# Patient Record
Sex: Female | Born: 1970
Health system: Southern US, Community
[De-identification: ages and names within clinical notes are randomized; demographics above are authoritative.]

## PROBLEM LIST (undated history)

## (undated) DIAGNOSIS — I10 Essential (primary) hypertension: Secondary | ICD-10-CM

## (undated) DIAGNOSIS — M5136 Other intervertebral disc degeneration, lumbar region: Secondary | ICD-10-CM

## (undated) DIAGNOSIS — M51369 Other intervertebral disc degeneration, lumbar region without mention of lumbar back pain or lower extremity pain: Secondary | ICD-10-CM

## (undated) DIAGNOSIS — F909 Attention-deficit hyperactivity disorder, unspecified type: Secondary | ICD-10-CM

## (undated) HISTORY — PX: TONSILLECTOMY: SUR1361

## (undated) HISTORY — DX: Essential (primary) hypertension: I10

## (undated) HISTORY — DX: Other intervertebral disc degeneration, lumbar region: M51.36

## (undated) HISTORY — DX: Other intervertebral disc degeneration, lumbar region without mention of lumbar back pain or lower extremity pain: M51.369

## (undated) HISTORY — DX: Attention-deficit hyperactivity disorder, unspecified type: F90.9

---

## 2003-08-10 ENCOUNTER — Other Ambulatory Visit: Admission: RE | Admit: 2003-08-10 | Discharge: 2003-08-10 | Payer: Self-pay

## 2004-07-16 ENCOUNTER — Other Ambulatory Visit: Admission: RE | Admit: 2004-07-16 | Discharge: 2004-07-16 | Payer: Self-pay | Admitting: Obstetrics and Gynecology

## 2004-09-12 ENCOUNTER — Encounter: Admission: RE | Admit: 2004-09-12 | Discharge: 2004-09-12 | Payer: Self-pay | Admitting: Obstetrics and Gynecology

## 2005-07-31 ENCOUNTER — Encounter: Admission: RE | Admit: 2005-07-31 | Discharge: 2005-07-31 | Payer: Self-pay | Admitting: Family Medicine

## 2006-01-28 ENCOUNTER — Other Ambulatory Visit: Admission: RE | Admit: 2006-01-28 | Discharge: 2006-01-28 | Payer: Self-pay | Admitting: Obstetrics and Gynecology

## 2008-02-20 ENCOUNTER — Emergency Department (HOSPITAL_COMMUNITY): Admission: EM | Admit: 2008-02-20 | Discharge: 2008-02-20 | Payer: Self-pay | Admitting: Family Medicine

## 2008-12-28 HISTORY — PX: LUMBAR DISC SURGERY: SHX700

## 2009-04-08 ENCOUNTER — Emergency Department (HOSPITAL_COMMUNITY): Admission: EM | Admit: 2009-04-08 | Discharge: 2009-04-08 | Payer: Self-pay | Admitting: Family Medicine

## 2010-05-08 ENCOUNTER — Ambulatory Visit (HOSPITAL_COMMUNITY): Admission: RE | Admit: 2010-05-08 | Discharge: 2010-05-09 | Payer: Self-pay | Admitting: Neurosurgery

## 2011-03-17 LAB — SURGICAL PCR SCREEN
MRSA, PCR: NEGATIVE
Staphylococcus aureus: NEGATIVE

## 2011-03-17 LAB — CBC
HCT: 37.5 % (ref 36.0–46.0)
Hemoglobin: 13.2 g/dL (ref 12.0–15.0)
MCHC: 35.1 g/dL (ref 30.0–36.0)
MCV: 99.3 fL (ref 78.0–100.0)
Platelets: 244 10*3/uL (ref 150–400)
RBC: 3.77 MIL/uL — ABNORMAL LOW (ref 3.87–5.11)
RDW: 12.4 % (ref 11.5–15.5)
WBC: 6.5 10*3/uL (ref 4.0–10.5)

## 2016-01-02 MED FILL — VYVANSE 30 MG CAPSULE: 30 | 30 days supply | Qty: 30 | Fill #0

## 2016-01-30 ENCOUNTER — Encounter: Payer: Self-pay | Admitting: Cardiovascular Disease

## 2016-01-30 ENCOUNTER — Ambulatory Visit (INDEPENDENT_AMBULATORY_CARE_PROVIDER_SITE_OTHER): Payer: 59 | Admitting: Cardiovascular Disease

## 2016-01-30 VITALS — BP 120/90 | HR 72 | Ht 66.0 in | Wt 162.0 lb

## 2016-01-30 DIAGNOSIS — R0789 Other chest pain: Secondary | ICD-10-CM | POA: Diagnosis not present

## 2016-01-30 DIAGNOSIS — I10 Essential (primary) hypertension: Secondary | ICD-10-CM | POA: Diagnosis not present

## 2016-01-30 NOTE — Patient Instructions (Signed)
Medication Instructions:  Your physician recommends that you continue on your current medications as directed. Please refer to the Current Medication list given to you today.  Labwork: No new orders.   Testing/Procedures: Your physician has requested that you have a stress echocardiogram. For further information please visit [email or web address]. Please follow instruction sheet as given.  Follow-Up: Your physician recommends that you schedule a follow-up appointment as needed with Dr Cooper.    Any Other Special Instructions Will Be Listed Below (If Applicable).     If you need a refill on your cardiac medications before your next appointment, please call your pharmacy.   

## 2016-01-30 NOTE — Progress Notes (Signed)
Cardiology Office Note Date:  01/30/2016   ID:  Alison [name], DOB 1971-10-07, MRN [id number]  PCP:  Lilia Argue  Cardiologist:  Tonny Bollman, MD    Chief Complaint  Patient presents with  . Chest Pain   History of Present Illness: Alison [name] is a 45 y.o. female who presents for evaluation of chest pain. She describes about one - two months of left-sided and substernal chest pain. Pain is left-sided, feels sharp, and radiates to the left shoulder. Symptoms last up to a few minutes and have occurred at rest. Denies exertional chest discomfort but hasn't been exercising.  She denies associated symptoms. She specifically denies shortness of breath, leg swelling, or heart palpitations. There is no positional component to her chest pain. Symptoms have progressed over the last month.  Past Medical History  Diagnosis Date  . Adult ADHD     Past Surgical History  Procedure Laterality Date  . Tonsillectomy    . Lower back surgery    . Disect    . Cervical discectomy      Current Outpatient Prescriptions  Medication Sig Dispense Refill  . lisdexamfetamine (VYVANSE) 30 MG capsule Take 30 mg by mouth daily.    . metoprolol succinate (TOPROL-XL) 25 MG 24 hr tablet Take 25 mg by mouth daily.     No current facility-administered medications for this visit.    Allergies:   Review of patient's allergies indicates no known allergies.   Social History:  The patient  reports that she has quit smoking. Her smoking use included Cigarettes. She does not have any smokeless tobacco history on file. She reports that she does not drink alcohol or use illicit drugs.   Family History:  The patient's  family history includes Anxiety disorder in her sister; Atrial fibrillation in her father; COPD in her father; Fibroids in her sister; Goiter in her mother; Heart disease in her father, maternal uncle, and mother; Hypertension in her father, mother, and sister; Lymphoma in her father;  Stroke in her mother.   Mother died of a stroke at age 53. She had an autopsy showing multivessel CAD.   ROS:  Please see the history of present illness.  Otherwise, review of systems is positive for chest pain, anxiety.  All other systems are reviewed and negative.    PHYSICAL EXAM: VS:  BP 120/90 mmHg  Pulse 72  Ht  (1.676 m)  Wt 73.483 kg (162 lb)  BMI 26.16 kg/m2 , BMI Body mass index is 26.16 kg/(m^2). GEN: Well nourished, well developed, in no acute distress HEENT: normal Neck: no JVD, no masses. No carotid bruits Cardiac: RRR without murmur or gallop                Respiratory:  clear to auscultation bilaterally, normal work of breathing GI: soft, nontender, nondistended, + BS MS: no deformity or atrophy Ext: no pretibial edema, pedal pulses 2+= bilaterally Skin: warm and dry, no rash Neuro:  Strength and sensation are intact Psych: euthymic mood, full affect  EKG:  EKG is ordered today. The ekg ordered today shows NSR 78 bpm, within normal limits  Recent Labs: No results found for requested labs within last 365 days.   Lipid Panel  No results found for: CHOL, TRIG, HDL, CHOLHDL, VLDL, LDLCALC, LDLDIRECT    Wt Readings from Last 3 Encounters:  01/30/16 73.483 kg (162 lb)    ASSESSMENT AND PLAN: 1.  Chest pain at rest: Cardiovascular risk factors include positive  family history and essential hypertension. The patient is not a smoker, nor does she have hyperlipidemia or diabetes. With progressive symptoms of chest pain, I think it is a test to evaluate her with stress testing. I have recommended an exercise stress echocardiogram. She will be advised to hold her metoprolol prior to the stress test. Otherwise will continue on current medicines. Other considerations include chest wall pain and stress. I think other serious causes of chest pain are highly unlikely and do not warrant further testing.  2. Essential hypertension: Patient will continue on metoprolol.  3.  Lipids: I have reviewed the patient's lab work from 10/30/2015. This shows a cholesterol of 177, triglycerides 96, HDL 73, and LDL 85.  Current medicines are reviewed with the patient today.  The patient does not have concerns regarding medicines.  Labs/ tests ordered today include:   Orders Placed This Encounter  Procedures  . EKG 12-Lead  . Echo stress   Disposition:   FU prn unless stress echo is abnormal  Signed, Tonny Bollman, MD  01/30/2016 4:39 PM    [facility] [address] [place], [place], [place]  [address] Phone: [phone]; Fax: [phone]

## 2016-01-31 MED FILL — VYVANSE 30 MG CAPSULE: 30 | 30 days supply | Qty: 30 | Fill #0

## 2016-02-04 DIAGNOSIS — I10 Essential (primary) hypertension: Secondary | ICD-10-CM | POA: Diagnosis not present

## 2016-02-04 DIAGNOSIS — Z79899 Other long term (current) drug therapy: Secondary | ICD-10-CM | POA: Diagnosis not present

## 2016-02-04 DIAGNOSIS — F419 Anxiety disorder, unspecified: Secondary | ICD-10-CM | POA: Diagnosis not present

## 2016-02-04 DIAGNOSIS — F902 Attention-deficit hyperactivity disorder, combined type: Secondary | ICD-10-CM | POA: Diagnosis not present

## 2016-02-13 ENCOUNTER — Ambulatory Visit (HOSPITAL_COMMUNITY): Payer: 59 | Attending: Internal Medicine

## 2016-02-13 ENCOUNTER — Ambulatory Visit (HOSPITAL_BASED_OUTPATIENT_CLINIC_OR_DEPARTMENT_OTHER): Payer: 59

## 2016-02-13 DIAGNOSIS — R0989 Other specified symptoms and signs involving the circulatory and respiratory systems: Secondary | ICD-10-CM

## 2016-02-13 DIAGNOSIS — R0789 Other chest pain: Secondary | ICD-10-CM | POA: Diagnosis not present

## 2016-02-13 DIAGNOSIS — E785 Hyperlipidemia, unspecified: Secondary | ICD-10-CM | POA: Diagnosis not present

## 2016-02-13 DIAGNOSIS — R079 Chest pain, unspecified: Secondary | ICD-10-CM | POA: Diagnosis present

## 2016-02-13 DIAGNOSIS — I10 Essential (primary) hypertension: Secondary | ICD-10-CM | POA: Diagnosis not present

## 2016-03-04 MED FILL — METOPROLOL TARTRATE 25 MG T: 25 | 90 days supply | Qty: 90 | Fill #1

## 2016-03-10 MED FILL — VYVANSE 30 MG CAPSULE: 30 | 30 days supply | Qty: 30 | Fill #0

## 2016-04-09 MED FILL — VYVANSE 30 MG CAPSULE: 30 | 30 days supply | Qty: 30 | Fill #0

## 2016-04-30 DIAGNOSIS — I10 Essential (primary) hypertension: Secondary | ICD-10-CM | POA: Diagnosis not present

## 2016-04-30 DIAGNOSIS — F902 Attention-deficit hyperactivity disorder, combined type: Secondary | ICD-10-CM | POA: Diagnosis not present

## 2016-04-30 DIAGNOSIS — F1920 Other psychoactive substance dependence, uncomplicated: Secondary | ICD-10-CM | POA: Diagnosis not present

## 2016-04-30 DIAGNOSIS — Z79899 Other long term (current) drug therapy: Secondary | ICD-10-CM | POA: Diagnosis not present

## 2016-04-30 DIAGNOSIS — F419 Anxiety disorder, unspecified: Secondary | ICD-10-CM | POA: Diagnosis not present

## 2016-04-30 MED FILL — VYVANSE 40 MG CAPSULE: 40 | 30 days supply | Qty: 30 | Fill #0

## 2016-06-03 DIAGNOSIS — I10 Essential (primary) hypertension: Secondary | ICD-10-CM | POA: Diagnosis not present

## 2016-06-03 DIAGNOSIS — F419 Anxiety disorder, unspecified: Secondary | ICD-10-CM | POA: Diagnosis not present

## 2016-06-03 DIAGNOSIS — Z79899 Other long term (current) drug therapy: Secondary | ICD-10-CM | POA: Diagnosis not present

## 2016-06-03 DIAGNOSIS — F902 Attention-deficit hyperactivity disorder, combined type: Secondary | ICD-10-CM | POA: Diagnosis not present

## 2016-06-03 MED FILL — METOPROLOL TARTRATE 25 MG T: 25 | 90 days supply | Qty: 90 | Fill #2

## 2016-06-03 MED FILL — VYVANSE 40 MG CAPSULE: 40 | 30 days supply | Qty: 30 | Fill #0

## 2016-07-06 MED FILL — VYVANSE 40 MG CAPSULE: 40 | 30 days supply | Qty: 30 | Fill #0

## 2016-08-10 MED FILL — VYVANSE 40 MG CAPSULE: 40 | 30 days supply | Qty: 30 | Fill #0

## 2016-09-01 DIAGNOSIS — F419 Anxiety disorder, unspecified: Secondary | ICD-10-CM | POA: Diagnosis not present

## 2016-09-01 DIAGNOSIS — I10 Essential (primary) hypertension: Secondary | ICD-10-CM | POA: Diagnosis not present

## 2016-09-01 DIAGNOSIS — Z79899 Other long term (current) drug therapy: Secondary | ICD-10-CM | POA: Diagnosis not present

## 2016-09-01 DIAGNOSIS — F902 Attention-deficit hyperactivity disorder, combined type: Secondary | ICD-10-CM | POA: Diagnosis not present

## 2016-09-03 MED FILL — METOPROLOL TARTRATE 25 MG T: 25 | 90 days supply | Qty: 90 | Fill #3

## 2016-09-10 MED FILL — VYVANSE 40 MG CAPSULE: 40 | 30 days supply | Qty: 30 | Fill #0

## 2016-10-13 MED FILL — VYVANSE 40 MG CAPSULE: 40 | 30 days supply | Qty: 30 | Fill #0

## 2016-11-17 MED FILL — VYVANSE 40 MG CAPSULE: 40 | 30 days supply | Qty: 30 | Fill #0

## 2016-12-08 MED FILL — METOPROLOL TARTRATE 25 MG T: 25 | 30 days supply | Qty: 30 | Fill #0

## 2016-12-25 MED FILL — VYVANSE 40 MG CAPSULE: 40 | 30 days supply | Qty: 30 | Fill #0

## 2017-01-05 DIAGNOSIS — I10 Essential (primary) hypertension: Secondary | ICD-10-CM | POA: Diagnosis not present

## 2017-01-05 MED FILL — METOPROLOL TARTRATE 25 MG T: 25 | 90 days supply | Qty: 135 | Fill #0

## 2017-01-19 DIAGNOSIS — Z23 Encounter for immunization: Secondary | ICD-10-CM | POA: Diagnosis not present

## 2017-01-19 DIAGNOSIS — I10 Essential (primary) hypertension: Secondary | ICD-10-CM | POA: Diagnosis not present

## 2017-01-29 MED FILL — VYVANSE 40 MG CAPSULE: 40 | 30 days supply | Qty: 30 | Fill #0

## 2017-03-11 DIAGNOSIS — F419 Anxiety disorder, unspecified: Secondary | ICD-10-CM | POA: Diagnosis not present

## 2017-03-11 DIAGNOSIS — F902 Attention-deficit hyperactivity disorder, combined type: Secondary | ICD-10-CM | POA: Diagnosis not present

## 2017-03-11 DIAGNOSIS — I10 Essential (primary) hypertension: Secondary | ICD-10-CM | POA: Diagnosis not present

## 2017-03-11 DIAGNOSIS — Z79899 Other long term (current) drug therapy: Secondary | ICD-10-CM | POA: Diagnosis not present

## 2017-03-11 MED FILL — ADZENYS XR-ODT 12.5 MG TAB: 12.5 | 15 days supply | Qty: 30 | Fill #0

## 2017-03-25 MED FILL — ADZENYS XR-ODT 12.5 MG TAB: 12.5 | 15 days supply | Qty: 30 | Fill #0

## 2017-03-31 MED FILL — PENICILLIN VK 500 MG TABLET: 500 | 12 days supply | Qty: 36 | Fill #0

## 2017-04-15 MED FILL — ADZENYS XR-ODT 12.5 MG TAB: 12.5 | 30 days supply | Qty: 60 | Fill #0

## 2017-05-18 MED FILL — METOPROLOL TARTRATE 25 MG T: 25 | 90 days supply | Qty: 135 | Fill #1

## 2017-05-21 MED FILL — ADZENYS XR-ODT 12.5 MG TAB: 12.5 | 30 days supply | Qty: 60 | Fill #0

## 2017-06-11 DIAGNOSIS — F902 Attention-deficit hyperactivity disorder, combined type: Secondary | ICD-10-CM | POA: Diagnosis not present

## 2017-06-11 DIAGNOSIS — Z79899 Other long term (current) drug therapy: Secondary | ICD-10-CM | POA: Diagnosis not present

## 2017-06-11 DIAGNOSIS — F419 Anxiety disorder, unspecified: Secondary | ICD-10-CM | POA: Diagnosis not present

## 2017-06-21 MED FILL — ADZENYS XR-ODT 12.5 MG TAB: 12.5 | 30 days supply | Qty: 60 | Fill #0

## 2017-07-28 MED FILL — ADZENYS XR-ODT 12.5 MG TAB: 12.5 | 30 days supply | Qty: 60 | Fill #0

## 2017-09-03 MED FILL — ADZENYS XR-ODT 12.5 MG TAB: 12.5 | 30 days supply | Qty: 60 | Fill #0

## 2017-09-08 MED FILL — METOPROLOL TARTRATE 25 MG T: 25 | 90 days supply | Qty: 135 | Fill #2

## 2017-09-13 DIAGNOSIS — I10 Essential (primary) hypertension: Secondary | ICD-10-CM | POA: Diagnosis not present

## 2017-09-13 DIAGNOSIS — F419 Anxiety disorder, unspecified: Secondary | ICD-10-CM | POA: Diagnosis not present

## 2017-09-13 DIAGNOSIS — F902 Attention-deficit hyperactivity disorder, combined type: Secondary | ICD-10-CM | POA: Diagnosis not present

## 2017-09-13 DIAGNOSIS — Z79899 Other long term (current) drug therapy: Secondary | ICD-10-CM | POA: Diagnosis not present

## 2017-10-08 MED FILL — ADZENYS XR-ODT 12.5 MG TAB: 12.5 | 30 days supply | Qty: 60 | Fill #0

## 2017-11-01 DIAGNOSIS — Z01419 Encounter for gynecological examination (general) (routine) without abnormal findings: Secondary | ICD-10-CM | POA: Diagnosis not present

## 2017-11-01 DIAGNOSIS — Z1231 Encounter for screening mammogram for malignant neoplasm of breast: Secondary | ICD-10-CM | POA: Diagnosis not present

## 2017-11-01 DIAGNOSIS — Z6824 Body mass index (BMI) 24.0-24.9, adult: Secondary | ICD-10-CM | POA: Diagnosis not present

## 2017-11-16 MED FILL — ADZENYS XR-ODT 12.5 MG TAB: 12.5 | 30 days supply | Qty: 60 | Fill #0

## 2017-11-27 LAB — HM MAMMOGRAPHY

## 2017-11-27 LAB — HM PAP SMEAR: HM Pap smear: NEGATIVE

## 2018-01-05 MED FILL — METOPROLOL TARTRATE 25 MG T: 25 | 90 days supply | Qty: 135 | Fill #3

## 2018-05-09 ENCOUNTER — Encounter: Payer: Self-pay | Admitting: Family Medicine

## 2018-05-09 ENCOUNTER — Other Ambulatory Visit: Payer: Self-pay

## 2018-05-09 ENCOUNTER — Ambulatory Visit (INDEPENDENT_AMBULATORY_CARE_PROVIDER_SITE_OTHER): Payer: No Typology Code available for payment source | Admitting: Family Medicine

## 2018-05-09 VITALS — BP 126/90 | HR 85 | Temp 98.4°F | Ht 66.0 in | Wt 164.6 lb

## 2018-05-09 DIAGNOSIS — Z0000 Encounter for general adult medical examination without abnormal findings: Secondary | ICD-10-CM | POA: Diagnosis not present

## 2018-05-09 DIAGNOSIS — Z975 Presence of (intrauterine) contraceptive device: Secondary | ICD-10-CM | POA: Insufficient documentation

## 2018-05-09 DIAGNOSIS — M47816 Spondylosis without myelopathy or radiculopathy, lumbar region: Secondary | ICD-10-CM | POA: Insufficient documentation

## 2018-05-09 DIAGNOSIS — F988 Other specified behavioral and emotional disorders with onset usually occurring in childhood and adolescence: Secondary | ICD-10-CM | POA: Insufficient documentation

## 2018-05-09 DIAGNOSIS — I10 Essential (primary) hypertension: Secondary | ICD-10-CM | POA: Insufficient documentation

## 2018-05-09 LAB — COMPREHENSIVE METABOLIC PANEL
ALK PHOS: 65 U/L (ref 39–117)
ALT: 16 U/L (ref 0–35)
AST: 20 U/L (ref 0–37)
Albumin: 4.6 g/dL (ref 3.5–5.2)
BUN: 15 mg/dL (ref 6–23)
CHLORIDE: 101 meq/L (ref 96–112)
CO2: 29 meq/L (ref 19–32)
Calcium: 9.4 mg/dL (ref 8.4–10.5)
Creatinine, Ser: 0.77 mg/dL (ref 0.40–1.20)
GFR: 84.73 mL/min (ref >60.00)
GLUCOSE: 92 mg/dL (ref 70–99)
POTASSIUM: 4.2 meq/L (ref 3.5–5.1)
Sodium: 137 mEq/L (ref 135–145)
Total Bilirubin: 0.5 mg/dL (ref 0.2–1.2)
Total Protein: 7.7 g/dL (ref 6.0–8.3)

## 2018-05-09 LAB — CBC WITH DIFFERENTIAL/PLATELET
Basophils Absolute: 0 10*3/uL (ref 0.0–0.1)
Basophils Relative: 0.7 % (ref 0.0–3.0)
Eosinophils Absolute: 0.2 10*3/uL (ref 0.0–0.7)
Eosinophils Relative: 2.4 % (ref 0.0–5.0)
HCT: 39.7 % (ref 36.0–46.0)
Hemoglobin: 13.8 g/dL (ref 12.0–15.0)
LYMPHS ABS: 1.4 10*3/uL (ref 0.7–4.0)
Lymphocytes Relative: 20.7 % (ref 12.0–46.0)
MCHC: 34.6 g/dL (ref 30.0–36.0)
MCV: 102.5 fl — AB (ref 78.0–100.0)
MONOS PCT: 5.6 % (ref 3.0–12.0)
Monocytes Absolute: 0.4 10*3/uL (ref 0.1–1.0)
NEUTROS PCT: 70.6 % (ref 43.0–77.0)
Neutro Abs: 4.7 10*3/uL (ref 1.4–7.7)
Platelets: 309 10*3/uL (ref 150.0–400.0)
RBC: 3.88 Mil/uL (ref 3.87–5.11)
RDW: 12.4 % (ref 11.5–15.5)
WBC: 6.7 10*3/uL (ref 4.0–10.5)

## 2018-05-09 LAB — LIPID PANEL
Cholesterol: 221 mg/dL — ABNORMAL HIGH (ref 0–200)
HDL: 83 mg/dL (ref >39.00)
LDL Cholesterol: 116 mg/dL — ABNORMAL HIGH (ref 0–99)
NONHDL: 138.43
Total CHOL/HDL Ratio: 3
Triglycerides: 111 mg/dL (ref 0.0–149.0)
VLDL: 22.2 mg/dL (ref 0.0–40.0)

## 2018-05-09 MED ORDER — METOPROLOL SUCCINATE ER 50 MG PO TB24: 50.0000 mg | ORAL_TABLET | Freq: Every day | ORAL | 1 refills | Status: DC

## 2018-05-09 MED ORDER — AMPHETAMINE-DEXTROAMPHET ER 20 MG PO CP24: 20.0000 mg | ORAL_CAPSULE | ORAL | 0 refills | Status: DC

## 2018-05-09 MED ORDER — AMPHETAMINE-DEXTROAMPHET ER 20 MG PO CP24
20.0000 mg | ORAL_CAPSULE | ORAL | 0 refills | Status: DC
Start: 2018-05-09 — End: 2018-05-09

## 2018-05-09 MED FILL — ADDERALL XR 20 MG CAP SA: 20 | 30 days supply | Qty: 30 | Fill #0

## 2018-05-09 MED FILL — METOPROLOL SUCCINATE ER 50: 50 | 90 days supply | Qty: 90 | Fill #0 | Status: TO

## 2018-05-09 NOTE — Progress Notes (Signed)
Subjective  CC:  Chief Complaint  Patient presents with  . Establish Care    focus plan, transfer from [facility] [place]   . Hypertension  . ADHD    HPI: Alison [name] is a 47 y.o. female who presents to [facility] today to establish care with me as a new patient.   She has the following concerns or needs:  HTN: on metoprolol 25 q am and 12.5 qhs. Home bp remain mildly elevated with systolics in the upper 80s to low 90s. Feels fine. Has CAD in FH: mom and dad had early deaths (30s). No h/o hyperlipidemia or DM; non smoker. No cp, palpitations or LE edema. Not currently on stimulants for ADD:  ADD: has been treated/managed by [facility]; last was on a branded long acting stimulant (?Metadate CD); had used vyvanse in past with good results but had to change due to insurance coverage issues. Has also been successful with Adderall. Uses it for work. No adverse effects.   HM: sees GYN and is up to date on female wellness and screens. Healthy lifestyle. In monogamous relationship of 3 years; happy, mother of 50 yo daughter. No HR behaviors.   We updated and reviewed the patient's past history in detail and it is documented below.  Patient Active Problem List   Diagnosis Date Noted  . Essential hypertension 05/09/2018    Priority: High  . Attention deficit disorder (ADD) in adult 05/09/2018    Priority: High  . IUD (intrauterine device) in place 05/09/2018    Priority: Medium    Due out 2020   . DJD (degenerative joint disease), lumbar 05/09/2018    Priority: Medium   Health Maintenance  Topic Date Due  . HIV Screening  06/24/1984  . INFLUENZA VACCINE  07/28/2018  . MAMMOGRAM  11/27/2018  . PAP SMEAR  11/27/2022  . TETANUS/TDAP  01/19/2027   Immunization History  Administered Date(s) Administered  . Influenza-Unspecified 09/27/2016  . Tdap 01/19/2017   Current Meds  Medication Sig  . metoprolol succinate  (TOPROL-XL) 50 MG 24 hr tablet Take 1 tablet (50 mg total) by mouth daily.  . [DISCONTINUED] metoprolol succinate (TOPROL-XL) 25 MG 24 hr tablet Take 25 mg by mouth daily.    Allergies: Patient has No Known Allergies. Past Medical History Patient  has a past medical history of Adult ADHD, Degeneration of intervertebral disc of lumbar region, and Hypertension. Past Surgical History Patient  has a past surgical history that includes Tonsillectomy and LOWER BACK SURGERY. Family History: Patient family history includes Anxiety disorder in her sister; Atrial fibrillation in her father; COPD in her father; Fibroids in her sister; Goiter in her mother; Healthy in her daughter; Heart disease in her father, maternal uncle, and mother; Hypertension in her father, mother, and sister; Lymphoma in her father; Stroke in her mother. Social History:  Patient  reports that she has quit smoking. Her smoking use included cigarettes. She has never used smokeless tobacco. She reports that she does not drink alcohol or use drugs.  Review of Systems: Constitutional: negative for fever or malaise Ophthalmic: negative for photophobia, double vision or loss of vision Cardiovascular: negative for chest pain, dyspnea on exertion, or new LE swelling Respiratory: negative for SOB or persistent cough Gastrointestinal: negative for abdominal pain, change in bowel habits or melena Genitourinary: negative for dysuria or gross hematuria Musculoskeletal: negative for new gait disturbance or muscular weakness, + chronic intermittent low back pain Integumentary: negative  for new or persistent rashes Neurological: negative for TIA or stroke symptoms Psychiatric: negative for SI or delusions Allergic/Immunologic: negative for hives  Patient Care Team    Relationship Specialty Notifications Start End  Willow Ora, MD PCP - General Family Medicine  05/09/18   Richarda Overlie, MD Consulting Physician Obstetrics and  Gynecology  05/09/18   Shirlean Kelly, MD Consulting Physician Neurosurgery  05/09/18     Objective  Vitals: BP 126/90   Pulse 85   Temp 98.4 F (36.9 C)   Ht  (1.676 m)   Wt 164 lb 9.6 oz (74.7 kg)   BMI 26.57 kg/m  General:  Well developed, well nourished, no acute distress  Psych:  Alert and oriented,normal mood and affect HEENT:  Normocephalic, atraumatic, non-icteric sclera, PERRL, oropharynx is without mass or exudate, supple neck without adenopathy, mass or thyromegaly Cardiovascular:  RRR without gallop, rub or murmur, nondisplaced PMI Respiratory:  Good breath sounds bilaterally, CTAB with normal respiratory effort Gastrointestinal: normal bowel sounds, soft, non-tender, no noted masses. No HSM MSK: no deformities, contusions. Joints are without erythema or swelling Skin:  Warm, no rashes or suspicious lesions noted Neurologic:    Mental status is normal. Gross motor and sensory exams are normal. Normal gait  Assessment  1. Annual physical exam   2. Attention deficit disorder (ADD) in adult   3. IUD (intrauterine device) in place   4. Spondylosis of lumbar region without myelopathy or radiculopathy      Plan  Female Wellness Visit:  Age appropriate Health Maintenance and Prevention measures were discussed with patient. Included topics are cancer screening recommendations, ways to keep healthy (see AVS) including dietary and exercise recommendations, regular eye and dental care, use of seat belts, and avoidance of moderate alcohol use and tobacco use. Screens are up to date  BMI: discussed patient's BMI and encouraged positive lifestyle modifications to help get to or maintain a target BMI.  HM needs and immunizations were addressed and ordered. See below for orders. See HM and immunization section for updates.  Routine labs and screening tests ordered including cmp, cbc and lipids where appropriate.  Discussed recommendations regarding Vit D and calcium  supplementation (see AVS)  HTN: fair control. Increase toprol xl to 50 mg daily and monitor at work. Should get her to controlled. Check renal function and electrolytes and lipids.   ADD: restart meds: adderall xr and f/u 3 months; adjust dose or medication if needed at that time. Well controlled previously.     Follow up:  Return in about 3 months (around 08/09/2018) for follow up Hypertension, follow up on ADD.  Commons side effects, risks, benefits, and alternatives for medications and treatment plan prescribed today were discussed, and the patient expressed understanding of the given instructions. Patient is instructed to call or message via MyChart if he/she has any questions or concerns regarding our treatment plan. No barriers to understanding were identified. We discussed Red Flag symptoms and signs in detail. Patient expressed understanding regarding what to do in case of urgent or emergency type symptoms.   Medication list was reconciled, printed and provided to the patient in AVS. Patient instructions and summary information was reviewed with the patient as documented in the AVS. This note was prepared with assistance of Dragon voice recognition software. Occasional wrong-word or sound-a-like substitutions may have occurred due to the inherent limitations of voice recognition software  Orders Placed This Encounter  Procedures  . HM MAMMOGRAPHY  . HM PAP SMEAR  .  CBC with Differential/Platelet  . Comprehensive metabolic panel  . Lipid panel  . HIV antibody   Meds ordered this encounter  Medications  . metoprolol succinate (TOPROL-XL) 50 MG 24 hr tablet    Sig: Take 1 tablet (50 mg total) by mouth daily.    Dispense:  90 tablet    Refill:  1  . DISCONTD: amphetamine-dextroamphetamine (ADDERALL XR) 20 MG 24 hr capsule    Sig: Take 1 capsule (20 mg total) by mouth every morning.    Dispense:  30 capsule    Refill:  0  . DISCONTD: amphetamine-dextroamphetamine (ADDERALL XR) 20  MG 24 hr capsule    Sig: Take 1 capsule (20 mg total) by mouth every morning.    Dispense:  30 capsule    Refill:  0  . amphetamine-dextroamphetamine (ADDERALL XR) 20 MG 24 hr capsule    Sig: Take 1 capsule (20 mg total) by mouth every morning.    Dispense:  30 capsule    Refill:  0

## 2018-05-09 NOTE — Patient Instructions (Signed)
Please return in 3 months to recheck your blood pressure and ADD medications.   I will release your lab results to you on your MyChart account with further instructions. Please reply with any questions.    It was a pleasure meeting you today! Thank you for choosing [place] to meet your healthcare needs! I truly look forward to working with you. If you have any questions or concerns, please send me a message via Mychart or call the office at [phone].  Please do these things to maintain good health!   Exercise at least 30-45 minutes a day,  4-5 days a week.   Eat a low-fat diet with lots of fruits and vegetables, up to 7-9 servings per day.  Drink plenty of water daily. Try to drink 8 8oz glasses per day.  Seatbelts can save your life. Always wear your seatbelt.  Place Smoke Detectors on every level of your home and check batteries every year.  Schedule an appointment with an eye doctor for an eye exam every 1-2 years  Safe sex - use condoms to protect yourself from STDs if you could be exposed to these types of infections. Use birth control if you do not want to become pregnant and are sexually active.  Avoid heavy alcohol use. If you drink, keep it to less than 2 drinks/day and not every day.  Health Care Power of Attorney.  Choose someone you trust that could speak for you if you became unable to speak for yourself.  Depression is common in our stressful world.If you're feeling down or losing interest in things you normally enjoy, please come in for a visit.  If anyone is threatening or hurting you, please get help. Physical or Emotional Violence is never OK.

## 2018-05-10 LAB — HIV ANTIBODY (ROUTINE TESTING W REFLEX): HIV: NONREACTIVE

## 2018-05-12 ENCOUNTER — Encounter: Payer: Self-pay | Admitting: Emergency Medicine

## 2018-06-08 MED FILL — ADDERALL XR 20 MG CAP SA: 20 | 30 days supply | Qty: 30 | Fill #0

## 2018-07-08 MED FILL — ADDERALL XR 20 MG CAP SA: 20 | 30 days supply | Qty: 30 | Fill #0

## 2018-08-08 ENCOUNTER — Other Ambulatory Visit: Payer: Self-pay

## 2018-08-08 ENCOUNTER — Encounter: Payer: Self-pay | Admitting: Family Medicine

## 2018-08-08 ENCOUNTER — Ambulatory Visit (INDEPENDENT_AMBULATORY_CARE_PROVIDER_SITE_OTHER): Payer: No Typology Code available for payment source | Admitting: Family Medicine

## 2018-08-08 VITALS — BP 118/80 | HR 66 | Temp 98.8°F | Ht 66.0 in | Wt 160.6 lb

## 2018-08-08 DIAGNOSIS — F988 Other specified behavioral and emotional disorders with onset usually occurring in childhood and adolescence: Secondary | ICD-10-CM | POA: Diagnosis not present

## 2018-08-08 DIAGNOSIS — I10 Essential (primary) hypertension: Secondary | ICD-10-CM | POA: Diagnosis not present

## 2018-08-08 MED ORDER — AMPHETAMINE-DEXTROAMPHET ER 20 MG PO CP24
20.0000 mg | ORAL_CAPSULE | ORAL | 0 refills | Status: DC
Start: 2018-09-07 — End: 2018-08-08

## 2018-08-08 MED ORDER — AMPHETAMINE-DEXTROAMPHET ER 20 MG PO CP24: 20.0000 mg | ORAL_CAPSULE | ORAL | 0 refills | Status: DC

## 2018-08-08 MED FILL — ADDERALL XR 20 MG CAP SA: 20 | 30 days supply | Qty: 30 | Fill #0

## 2018-08-08 MED FILL — METOPROLOL SUCCINATE ER 50: 50 | 90 days supply | Qty: 90 | Fill #0

## 2018-08-08 NOTE — Patient Instructions (Signed)
Please return in 3 months for ADD follow up and refills.   Your blood pressure looks great.   If you have any questions or concerns, please don't hesitate to send me a message via MyChart or call the office at [phone]. Thank you for visiting with us today! It's our pleasure caring for you.

## 2018-08-08 NOTE — Progress Notes (Signed)
Subjective  CC:  Chief Complaint  Patient presents with  . Hypertension    doing well, no complaints  . ADHD    HPI: Alison [name] is a 47 y.o. female who presents to the office today to address the problems listed above in the chief complaint.  Hypertension f/u: Control is improved.increased toprol xl from 25 to 50 last visit.  Pt reports she is doing well. taking medications as instructed, no medication side effects noted, no TIAs, no chest pain on exertion, no dyspnea on exertion, no swelling of ankles. Has lost a few pounds as well. She denies adverse effects from his BP medications. Compliance with medication is good.   Assessment  1. Essential hypertension   2. Attention deficit disorder (ADD) in adult      Plan    Hypertension f/u: BP control is well controlled. Continue current meds.  Hyperlipidemia f/u: at goal  ADD: doing well on adderallxr. Completed uds and controlled substance agreement today.  Education regarding management of these chronic disease states was given. Management strategies discussed on successive visits include dietary and exercise recommendations, goals of achieving and maintaining IBW, and lifestyle modifications aiming for adequate sleep and minimizing stressors.   Follow up: Return in about 3 months (around 11/08/2018) for follow up on ADD.  No orders of the defined types were placed in this encounter.  Meds ordered this encounter  Medications  . DISCONTD: amphetamine-dextroamphetamine (ADDERALL XR) 20 MG 24 hr capsule    Sig: Take 1 capsule (20 mg total) by mouth every morning.    Dispense:  30 capsule    Refill:  0  . DISCONTD: amphetamine-dextroamphetamine (ADDERALL XR) 20 MG 24 hr capsule    Sig: Take 1 capsule (20 mg total) by mouth every morning.    Dispense:  30 capsule    Refill:  0  . amphetamine-dextroamphetamine (ADDERALL XR) 20 MG 24 hr capsule    Sig: Take 1 capsule (20 mg total) by mouth every morning.    Dispense:  30  capsule    Refill:  0      BP Readings from Last 3 Encounters:  08/08/18 118/80  05/09/18 126/90  01/30/16 120/90   Wt Readings from Last 3 Encounters:  08/08/18 160 lb 9.6 oz (72.8 kg)  05/09/18 164 lb 9.6 oz (74.7 kg)  01/30/16 162 lb (73.5 kg)    Lab Results  Component Value Date   CHOL 221 (H) 05/09/2018   Lab Results  Component Value Date   HDL 83.00 05/09/2018   Lab Results  Component Value Date   LDLCALC 116 (H) 05/09/2018   Lab Results  Component Value Date   TRIG 111.0 05/09/2018   Lab Results  Component Value Date   CHOLHDL 3 05/09/2018   No results found for: LDLDIRECT Lab Results  Component Value Date   CREATININE 0.77 05/09/2018   BUN 15 05/09/2018   NA 137 05/09/2018   K 4.2 05/09/2018   CL 101 05/09/2018   CO2 29 05/09/2018    The 10-year ASCVD risk score Denman George(Goff DC Jr., et al., 2013) is: 0.9%   Values used to calculate the score:     Age: [age] years     Sex: Female     Is Non-Hispanic African American: No     Diabetic: No     Tobacco smoker: No     Systolic Blood Pressure: 118 mmHg     Is BP treated: Yes     HDL Cholesterol:  83 mg/dL     Total Cholesterol: 221 mg/dL  I reviewed the patients updated PMH, FH, and SocHx.    Patient Active Problem List   Diagnosis Date Noted  . Essential hypertension 05/09/2018    Priority: High  . Attention deficit disorder (ADD) in adult 05/09/2018    Priority: High  . IUD (intrauterine device) in place 05/09/2018    Priority: Medium  . DJD (degenerative joint disease), lumbar 05/09/2018    Priority: Medium    Allergies: Patient has no known allergies.  Social History: Patient  reports that she has quit smoking. Her smoking use included cigarettes. She has never used smokeless tobacco. She reports that she does not drink alcohol or use drugs.  Current Meds  Medication Sig  . [START ON 10/07/2018] amphetamine-dextroamphetamine (ADDERALL XR) 20 MG 24 hr capsule Take 1 capsule (20 mg total) by  mouth every morning.  . metoprolol succinate (TOPROL-XL) 50 MG 24 hr tablet Take 1 tablet (50 mg total) by mouth daily.  . [DISCONTINUED] amphetamine-dextroamphetamine (ADDERALL XR) 20 MG 24 hr capsule Take 1 capsule (20 mg total) by mouth every morning.  . [DISCONTINUED] amphetamine-dextroamphetamine (ADDERALL XR) 20 MG 24 hr capsule Take 1 capsule (20 mg total) by mouth every morning.  . [DISCONTINUED] amphetamine-dextroamphetamine (ADDERALL XR) 20 MG 24 hr capsule Take 1 capsule (20 mg total) by mouth every morning.    Review of Systems: Cardiovascular: negative for chest pain, palpitations, leg swelling, orthopnea Respiratory: negative for SOB, wheezing or persistent cough Gastrointestinal: negative for abdominal pain Genitourinary: negative for dysuria or gross hematuria  Objective  Vitals: BP 118/80   Pulse 66   Temp 98.8 F (37.1 C)   Ht 5\' 6"  (1.676 m)   Wt 160 lb 9.6 oz (72.8 kg)   SpO2 98%   BMI 25.92 kg/m  General: no acute distress  Psych:  Alert and oriented, normal mood and affect HEENT:  Normocephalic, atraumatic, supple neck  Cardiovascular:  RRR without murmur. no edema Respiratory:  Good breath sounds bilaterally, CTAB with normal respiratory effort Skin:  Warm, no rashes Neurologic:   Mental status is normal  Commons side effects, risks, benefits, and alternatives for medications and treatment plan prescribed today were discussed, and the patient expressed understanding of the given instructions. Patient is instructed to call or message via MyChart if he/she has any questions or concerns regarding our treatment plan. No barriers to understanding were identified. We discussed Red Flag symptoms and signs in detail. Patient expressed understanding regarding what to do in case of urgent or emergency type symptoms.   Medication list was reconciled, printed and provided to the patient in AVS. Patient instructions and summary information was reviewed with the patient as  documented in the AVS. This note was prepared with assistance of Dragon voice recognition software. Occasional wrong-word or sound-a-like substitutions may have occurred due to the inherent limitations of voice recognition software

## 2018-08-10 LAB — PAIN MGMT, PROFILE 8 W/CONF, U
6 Acetylmorphine: NEGATIVE ng/mL (ref <10)
ALCOHOL METABOLITES: POSITIVE ng/mL — AB (ref <500)
AMPHETAMINES: POSITIVE ng/mL — AB (ref <500)
Amphetamine: 5180 ng/mL — ABNORMAL HIGH (ref <250)
BUPRENORPHINE, URINE: NEGATIVE ng/mL (ref <5)
Benzodiazepines: NEGATIVE ng/mL (ref <100)
COCAINE METABOLITE: NEGATIVE ng/mL (ref <150)
Creatinine: 87 mg/dL
Ethyl Glucuronide (ETG): 457153 ng/mL — ABNORMAL HIGH (ref <500)
MDMA: NEGATIVE ng/mL (ref <500)
Marijuana Metabolite: NEGATIVE ng/mL (ref <20)
Methamphetamine: NEGATIVE ng/mL (ref <250)
OXYCODONE: NEGATIVE ng/mL (ref <100)
Opiates: NEGATIVE ng/mL (ref <100)
Oxidant: NEGATIVE ug/mL (ref <200)
pH: 5.54 (ref 4.5–9.0)

## 2018-09-08 MED FILL — ADDERALL XR 20 MG CAP SA: 20 | 30 days supply | Qty: 30 | Fill #0

## 2018-10-07 MED FILL — ADDERALL XR 20 MG CAP SA: 20 | 30 days supply | Qty: 30 | Fill #0

## 2018-11-02 ENCOUNTER — Encounter: Payer: Self-pay | Admitting: Family Medicine

## 2018-11-02 ENCOUNTER — Ambulatory Visit (INDEPENDENT_AMBULATORY_CARE_PROVIDER_SITE_OTHER): Payer: No Typology Code available for payment source | Admitting: Family Medicine

## 2018-11-02 ENCOUNTER — Other Ambulatory Visit: Payer: Self-pay

## 2018-11-02 VITALS — BP 120/84 | HR 71 | Temp 98.0°F | Ht 66.0 in | Wt 160.2 lb

## 2018-11-02 DIAGNOSIS — F988 Other specified behavioral and emotional disorders with onset usually occurring in childhood and adolescence: Secondary | ICD-10-CM

## 2018-11-02 DIAGNOSIS — I10 Essential (primary) hypertension: Secondary | ICD-10-CM | POA: Diagnosis not present

## 2018-11-02 MED ORDER — AMPHETAMINE-DEXTROAMPHET ER 20 MG PO CP24: 20.0000 mg | ORAL_CAPSULE | ORAL | 0 refills | Status: DC

## 2018-11-02 MED ORDER — AMPHETAMINE-DEXTROAMPHET ER 20 MG PO CP24
20.0000 mg | ORAL_CAPSULE | ORAL | 0 refills | Status: DC
Start: 2019-01-01 — End: 2018-11-02

## 2018-11-02 MED ORDER — HYDROCHLOROTHIAZIDE 12.5 MG PO CAPS: 12.5000 mg | ORAL_CAPSULE | Freq: Every day | ORAL | 3 refills | Status: DC

## 2018-11-02 MED ORDER — AMPHETAMINE-DEXTROAMPHET ER 20 MG PO CP24
20.0000 mg | ORAL_CAPSULE | ORAL | 0 refills | Status: DC
Start: 2019-04-01 — End: 2018-11-02

## 2018-11-02 MED ORDER — METOPROLOL SUCCINATE ER 50 MG PO TB24: 50.0000 mg | ORAL_TABLET | Freq: Every day | ORAL | 3 refills | Status: DC

## 2018-11-02 MED FILL — METOPROLOL SUCCINATE ER 50: 50 | 90 days supply | Qty: 90 | Fill #0

## 2018-11-02 MED FILL — HYDROCHLOROTHIAZIDE 12.5 MG: 12.5 | 90 days supply | Qty: 90 | Fill #0

## 2018-11-02 NOTE — Progress Notes (Signed)
Subjective  CC:  Chief Complaint  Patient presents with  . ADHD    doing well, no complaints. doing well on medications    HPI: Alison [name] is a 47 y.o. female who presents to the office today to address the problems listed above in the chief complaint.  Patient is here today for follow up of ADD/ADHD. She is taking medication, adderall, as directed and continues to feel it is beneficial. The medications continue to help with focus and attention and task completion. She denies adverse side effects; specifically no headaches, appetite suppression, weight loss, sleeping difficulty, heart palpitations, chest pain or significant weight changes. Has been stable on this medication for last 6 mobths  This patient does not have contraindications for stimulant use include hypertension, tachycardia, arrhythmia, psychosis, bipolar disorder, severe anorexia, and Tourette syndrome.  Hypertension f/u: Control is good . Pt reports she is doing well. taking medications as instructed, no medication side effects noted, no TIAs, no chest pain on exertion, no dyspnea on exertion, no swelling of ankles. Diastolic runs in 40J consistently.  She denies adverse effects from his BP medications. Compliance with medication is good.   BP Readings from Last 3 Encounters:  11/02/18 120/84  08/08/18 118/80  05/09/18 126/90   Wt Readings from Last 3 Encounters:  11/02/18 160 lb 3.2 oz (72.7 kg)  08/08/18 160 lb 9.6 oz (72.8 kg)  05/09/18 164 lb 9.6 oz (74.7 kg)    Lab Results  Component Value Date   CHOL 221 (H) 05/09/2018   Lab Results  Component Value Date   HDL 83.00 05/09/2018   Lab Results  Component Value Date   LDLCALC 116 (H) 05/09/2018   Lab Results  Component Value Date   TRIG 111.0 05/09/2018   Lab Results  Component Value Date   CHOLHDL 3 05/09/2018   No results found for: LDLDIRECT Lab Results  Component Value Date   CREATININE 0.77 05/09/2018   BUN 15 05/09/2018   NA 137  05/09/2018   K 4.2 05/09/2018   CL 101 05/09/2018   CO2 29 05/09/2018    The 10-year ASCVD risk score Denman George DC Jr., et al., 2013) is: 0.9%   Values used to calculate the score:     Age: 30 years     Sex: Female     Is Non-Hispanic African American: No     Diabetic: No     Tobacco smoker: No     Systolic Blood Pressure: 120 mmHg     Is BP treated: Yes     HDL Cholesterol: 83 mg/dL     Total Cholesterol: 221 mg/dL   Assessment  1. Attention deficit disorder (ADD) in adult   2. Essential hypertension      Plan   ADD:  Good control. Refilled meds for 6 months. Sent one month to pharmacy and handed pt 5 printed RXs.   HTN: good control. Add low dose hctz to get to goal.   Follow up: Return in about 6 months (around 05/03/2019) for complete physical, follow up on ADD, follow up Hypertension.  No orders of the defined types were placed in this encounter.  Meds ordered this encounter  Medications  . DISCONTD: amphetamine-dextroamphetamine (ADDERALL XR) 20 MG 24 hr capsule    Sig: Take 1 capsule (20 mg total) by mouth every morning.    Dispense:  30 capsule    Refill:  0  . hydrochlorothiazide (MICROZIDE) 12.5 MG capsule    Sig: Take 1  capsule (12.5 mg total) by mouth daily.    Dispense:  90 capsule    Refill:  3  . DISCONTD: amphetamine-dextroamphetamine (ADDERALL XR) 20 MG 24 hr capsule    Sig: Take 1 capsule (20 mg total) by mouth every morning.    Dispense:  30 capsule    Refill:  0  . DISCONTD: amphetamine-dextroamphetamine (ADDERALL XR) 20 MG 24 hr capsule    Sig: Take 1 capsule (20 mg total) by mouth every morning.    Dispense:  30 capsule    Refill:  0  . DISCONTD: amphetamine-dextroamphetamine (ADDERALL XR) 20 MG 24 hr capsule    Sig: Take 1 capsule (20 mg total) by mouth every morning.    Dispense:  30 capsule    Refill:  0  . DISCONTD: amphetamine-dextroamphetamine (ADDERALL XR) 20 MG 24 hr capsule    Sig: Take 1 capsule (20 mg total) by mouth every morning.     Dispense:  30 capsule    Refill:  0  . DISCONTD: amphetamine-dextroamphetamine (ADDERALL XR) 20 MG 24 hr capsule    Sig: Take 1 capsule (20 mg total) by mouth every morning.    Dispense:  30 capsule    Refill:  0  . amphetamine-dextroamphetamine (ADDERALL XR) 20 MG 24 hr capsule    Sig: Take 1 capsule (20 mg total) by mouth every morning.    Dispense:  30 capsule    Refill:  0      I reviewed the patients updated PMH, FH, and SocHx.    Patient Active Problem List   Diagnosis Date Noted  . Essential hypertension 05/09/2018    Priority: High  . Attention deficit disorder (ADD) in adult 05/09/2018    Priority: High  . IUD (intrauterine device) in place 05/09/2018    Priority: Medium  . DJD (degenerative joint disease), lumbar 05/09/2018    Priority: Medium   Current Meds  Medication Sig  . [START ON 05/01/2019] amphetamine-dextroamphetamine (ADDERALL XR) 20 MG 24 hr capsule Take 1 capsule (20 mg total) by mouth every morning.  . metoprolol succinate (TOPROL-XL) 50 MG 24 hr tablet Take 1 tablet (50 mg total) by mouth daily.  . [DISCONTINUED] amphetamine-dextroamphetamine (ADDERALL XR) 20 MG 24 hr capsule Take 1 capsule (20 mg total) by mouth every morning.  . [DISCONTINUED] amphetamine-dextroamphetamine (ADDERALL XR) 20 MG 24 hr capsule Take 1 capsule (20 mg total) by mouth every morning.  . [DISCONTINUED] amphetamine-dextroamphetamine (ADDERALL XR) 20 MG 24 hr capsule Take 1 capsule (20 mg total) by mouth every morning.  . [DISCONTINUED] amphetamine-dextroamphetamine (ADDERALL XR) 20 MG 24 hr capsule Take 1 capsule (20 mg total) by mouth every morning.  . [DISCONTINUED] amphetamine-dextroamphetamine (ADDERALL XR) 20 MG 24 hr capsule Take 1 capsule (20 mg total) by mouth every morning.  . [DISCONTINUED] amphetamine-dextroamphetamine (ADDERALL XR) 20 MG 24 hr capsule Take 1 capsule (20 mg total) by mouth every morning.  . [DISCONTINUED] amphetamine-dextroamphetamine (ADDERALL XR) 20 MG  24 hr capsule Take 1 capsule (20 mg total) by mouth every morning.    Allergies: Patient has No Known Allergies. Family History: Patient family history includes Anxiety disorder in her sister; Atrial fibrillation in her father; COPD in her father; Fibroids in her sister; Goiter in her mother; Healthy in her daughter; Heart disease in her father, maternal uncle, and mother; Hypertension in her father, mother, and sister; Lymphoma in her father; Stroke in her mother. Social History:  Patient  reports that she has quit smoking. Her smoking use  included cigarettes. She has never used smokeless tobacco. She reports that she does not drink alcohol or use drugs.  Review of Systems: Constitutional: Negative for fever malaise or anorexia Cardiovascular: negative for chest pain Respiratory: negative for SOB or persistent cough Gastrointestinal: negative for abdominal pain  Objective  Vitals: BP 120/84   Pulse 71   Temp 98 F (36.7 C)   Ht 5\' 6"  (1.676 m)   Wt 160 lb 3.2 oz (72.7 kg)   SpO2 97%   BMI 25.86 kg/m  General: no acute distress , A&Ox3 HEENT: PEERL, conjunctiva normal, Oropharynx moist,neck is supple Cardiovascular:  RRR without murmur or gallop.  Respiratory:  Good breath sounds bilaterally, CTAB with normal respiratory effort Skin:  Warm, no rashes    Commons side effects, risks, benefits, and alternatives for medications and treatment plan prescribed today were discussed, and the patient expressed understanding of the given instructions. Patient is instructed to call or message via MyChart if he/she has any questions or concerns regarding our treatment plan. No barriers to understanding were identified. We discussed Red Flag symptoms and signs in detail. Patient expressed understanding regarding what to do in case of urgent or emergency type symptoms.   Medication list was reconciled, printed and provided to the patient in AVS. Patient instructions and summary information was  reviewed with the patient as documented in the AVS. This note was prepared with assistance of Dragon voice recognition software. Occasional wrong-word or sound-a-like substitutions may have occurred due to the inherent limitations of voice recognition software

## 2018-11-02 NOTE — Patient Instructions (Addendum)
Please return in 6 months  for your annual complete physical; please come fasting. And ADD and HTN f/u.   Please schedule your mammogram due in December.  Let me know if you have any problems taking the hctz for your blood pressure.   I have given you 5 months worth of prescription for your ADD. Keep them in a safe place for refills.   If you have any questions or concerns, please don't hesitate to send me a message via MyChart or call the office at [phone]. Thank you for visiting with [place] today! It's our pleasure caring for you.  Goal Blood pressure is < 120/70.

## 2018-11-02 NOTE — Addendum Note (Signed)
Addended byDene Gentry on: 11/02/2018 10:46 AM   Modules accepted: Orders

## 2018-11-08 MED FILL — ADDERALL XR 20 MG CAP SA: 20 | 30 days supply | Qty: 30 | Fill #0

## 2018-11-21 MED FILL — AMOXICILLIN 500 MG CAPSULE: 500 | 7 days supply | Qty: 28 | Fill #0

## 2018-12-02 MED FILL — AMOXICILLIN 500 MG CAPSULE: 500 | 7 days supply | Qty: 28 | Fill #1

## 2018-12-14 MED FILL — ADDERALL XR 20 MG CAP SA: 20 | 30 days supply | Qty: 30 | Fill #0

## 2019-01-19 MED FILL — ADDERALL XR 20 MG CAP SA: 20 | 30 days supply | Qty: 30 | Fill #0

## 2019-01-19 MED FILL — HYDROCHLOROTHIAZIDE 12.5 MG: 12.5 | 90 days supply | Qty: 90 | Fill #1

## 2019-01-19 MED FILL — METOPROLOL SUCCINATE ER 50: 50 | 90 days supply | Qty: 90 | Fill #1

## 2019-02-20 MED FILL — ADDERALL XR 20 MG CAP SA: 20 | 30 days supply | Qty: 30 | Fill #0

## 2019-03-21 MED FILL — ADDERALL XR 20 MG CAP SA: 20 | 30 days supply | Qty: 30 | Fill #0

## 2019-04-21 MED FILL — ADDERALL XR 20 MG CAP SA: 20 | 30 days supply | Qty: 30 | Fill #0

## 2019-05-01 ENCOUNTER — Encounter: Payer: Self-pay | Admitting: Family Medicine

## 2019-05-01 MED FILL — METOPROLOL SUCCINATE ER 50: 50 | 90 days supply | Qty: 90 | Fill #2

## 2019-05-01 MED FILL — HYDROCHLOROTHIAZIDE 12.5 MG: 12.5 | 90 days supply | Qty: 90 | Fill #2

## 2019-05-04 ENCOUNTER — Other Ambulatory Visit: Payer: Self-pay

## 2019-05-04 ENCOUNTER — Ambulatory Visit (INDEPENDENT_AMBULATORY_CARE_PROVIDER_SITE_OTHER): Payer: No Typology Code available for payment source | Admitting: Family Medicine

## 2019-05-04 ENCOUNTER — Encounter: Payer: Self-pay | Admitting: Family Medicine

## 2019-05-04 VITALS — Wt 156.0 lb

## 2019-05-04 DIAGNOSIS — F988 Other specified behavioral and emotional disorders with onset usually occurring in childhood and adolescence: Secondary | ICD-10-CM

## 2019-05-04 DIAGNOSIS — I10 Essential (primary) hypertension: Secondary | ICD-10-CM | POA: Diagnosis not present

## 2019-05-04 MED ORDER — AMPHETAMINE-DEXTROAMPHET ER 20 MG PO CP24
20.0000 mg | ORAL_CAPSULE | ORAL | 0 refills | Status: DC
Start: 2019-05-04 — End: 2019-06-20

## 2019-05-04 NOTE — Patient Instructions (Addendum)
Please check your blood pressures before your next appointment. And schedule your mammogram when you can.  Return for complete physical. 06/15/2019  Send me a mychart request for your next Adderall refill.

## 2019-05-04 NOTE — Assessment & Plan Note (Signed)
Clinically controlled and taking meds. She will start checking her home readings again to ensure control is good. Continue same meds. Has f/u in 6 weeks for recheck and labs.

## 2019-05-04 NOTE — Progress Notes (Signed)
Virtual Visit via Video Note  Subjective  CC:  Chief Complaint  Patient presents with  . Hypertension    Has not been checking BPs and no  . ADD     I connected with [name] on 05/04/19 at  8:00 AM EDT by a video enabled telemedicine application and verified that I am speaking with the correct person using two identifiers. Location patient: Home Location provider:  [facility] Persons participating in the virtual visit: CATI UHLE, Willow Ora, MD Rita Ohara, CMA  I discussed the limitations of evaluation and management by telemedicine and the availability of in person appointments. The patient expressed understanding and agreed to proceed. HPI: Alison [name] is a 48 y.o. female who was contacted today to address the problems listed above in the chief complaint/HTN f/u:   Hypertension f/u: in November, 6 months ago we added low dose hctz to get bp under good control. She is tolerating this well. Hasn't checked bp in awhile. Control is good . Pt reports she is doing well. taking medications as instructed, no medication side effects noted, no TIAs, no chest pain on exertion, no dyspnea on exertion, no swelling of ankles. She denies adverse effects from his BP medications. Compliance with medication is good.   ADD f/u: working from home for the last 8 weeks and doing well on meds. Continues to help with focus. No problems with sleep. 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 She is excited. Needs refills. Reviewed drug data ase.   BP Readings from Last 3 Encounters:  11/02/18 120/84  08/08/18 118/80  05/09/18 126/90   Wt Readings from Last 3 Encounters:  05/04/19 156 lb (70.8 kg)  11/02/18 160 lb 3.2 oz (72.7 kg)  08/08/18 160 lb 9.6 oz (72.8 kg)    Lab Results  Component Value Date   CHOL 221 (H) 05/09/2018   Lab Results  Component Value Date   HDL 83.00 05/09/2018   Lab Results  Component Value  Date   LDLCALC 116 (H) 05/09/2018   Lab Results  Component Value Date   TRIG 111.0 05/09/2018   Lab Results  Component Value Date   CHOLHDL 3 05/09/2018   No results found for: LDLDIRECT Lab Results  Component Value Date   CREATININE 0.77 05/09/2018   BUN 15 05/09/2018   NA 137 05/09/2018   K 4.2 05/09/2018   CL 101 05/09/2018   CO2 29 05/09/2018    The 10-year ASCVD risk score Denman George DC Jr., et al., 2013) is: 0.9%   Values used to calculate the score:     Age: 60 years     Sex: Female     Is Non-Hispanic African American: No     Diabetic: No     Tobacco smoker: No     Systolic Blood Pressure: 120 mmHg     Is BP treated: Yes     HDL Cholesterol: 83 mg/dL     Total Cholesterol: 221 mg/dL  Assessment  1. Essential hypertension   2. Attention deficit disorder (ADD) in adult      Plan   See below for problem based assessment and plan documentation  I discussed the assessment and treatment plan with the patient. The patient was provided an opportunity to ask questions and all were answered. The patient agreed with the plan and demonstrated an understanding of the instructions.   The patient was advised to  call back or seek an in-person evaluation if the symptoms worsen or if the condition fails to improve as anticipated. Follow up: return for cpe with labs and f/u htn  06/15/2019  No orders of the defined types were placed in this encounter.     I reviewed the patients updated PMH, FH, and SocHx.    Patient Active Problem List   Diagnosis Date Noted  . Essential hypertension 05/09/2018    Priority: High  . Attention deficit disorder (ADD) in adult 05/09/2018    Priority: High  . IUD (intrauterine device) in place 05/09/2018    Priority: Medium  . DJD (degenerative joint disease), lumbar 05/09/2018    Priority: Medium   Current Meds  Medication Sig  . amphetamine-dextroamphetamine (ADDERALL XR) 20 MG 24 hr capsule Take 1 capsule (20 mg total) by mouth every  morning.  . hydrochlorothiazide (MICROZIDE) 12.5 MG capsule Take 1 capsule (12.5 mg total) by mouth daily.  . metoprolol succinate (TOPROL-XL) 50 MG 24 hr tablet Take 1 tablet (50 mg total) by mouth daily.    Allergies: Patient has No Known Allergies. Family History: Patient family history includes Anxiety disorder in her sister; Atrial fibrillation in her father; COPD in her father; Fibroids in her sister; Goiter in her mother; Healthy in her daughter; Heart disease in her father, maternal uncle, and mother; Hypertension in her father, mother, and sister; Lymphoma in her father; Stroke in her mother. Social History:  Patient  reports that she has quit smoking. Her smoking use included cigarettes. She has never used smokeless tobacco. She reports that she does not drink alcohol or use drugs.  Review of Systems: Constitutional: Negative for fever malaise or anorexia Cardiovascular: negative for chest pain Respiratory: negative for SOB or persistent cough Gastrointestinal: negative for abdominal pain  OBJECTIVE Vitals: Wt 156 lb (70.8 kg)   BMI 25.18 kg/m  General: no acute distress , A&Ox3  Willow Oraamille L Radie Berges, MD

## 2019-05-04 NOTE — Assessment & Plan Note (Signed)
This medical condition is well controlled. There are no signs of complications, medication side effects, or red flags. Patient is instructed to continue the current treatment plan without change in therapies or medications.  The benefits of continuing opioid therapy outweigh the risks and chronic opioids will be continued. Ongoing educatio about safe opioid treatment provided. 1 months Rx e-scribed to pharmacy, pt to request refills via mychart when due. UDS today []  YES [x]   NO. Contract in place [x]  YES []  NO. Last usd 07/2018. Drug database reviewed and is appropriate

## 2019-05-23 MED FILL — ADDERALL XR 20 MG CAP SA: 20 | 30 days supply | Qty: 30 | Fill #0

## 2019-06-15 ENCOUNTER — Encounter: Payer: No Typology Code available for payment source | Admitting: Family Medicine

## 2019-06-20 ENCOUNTER — Ambulatory Visit (INDEPENDENT_AMBULATORY_CARE_PROVIDER_SITE_OTHER): Payer: No Typology Code available for payment source | Admitting: Family Medicine

## 2019-06-20 ENCOUNTER — Other Ambulatory Visit: Payer: Self-pay

## 2019-06-20 ENCOUNTER — Encounter: Payer: Self-pay | Admitting: Family Medicine

## 2019-06-20 VITALS — BP 126/88 | HR 66 | Temp 97.8°F | Resp 16 | Ht 66.0 in | Wt 159.0 lb

## 2019-06-20 DIAGNOSIS — I10 Essential (primary) hypertension: Secondary | ICD-10-CM | POA: Diagnosis not present

## 2019-06-20 DIAGNOSIS — N951 Menopausal and female climacteric states: Secondary | ICD-10-CM | POA: Diagnosis not present

## 2019-06-20 DIAGNOSIS — F988 Other specified behavioral and emotional disorders with onset usually occurring in childhood and adolescence: Secondary | ICD-10-CM

## 2019-06-20 DIAGNOSIS — Z0000 Encounter for general adult medical examination without abnormal findings: Secondary | ICD-10-CM | POA: Diagnosis not present

## 2019-06-20 DIAGNOSIS — E049 Nontoxic goiter, unspecified: Secondary | ICD-10-CM

## 2019-06-20 LAB — FOLLICLE STIMULATING HORMONE: FSH: 15.1 m[IU]/mL

## 2019-06-20 LAB — CBC WITH DIFFERENTIAL/PLATELET
Basophils Absolute: 0.1 10*3/uL (ref 0.0–0.1)
Basophils Relative: 1 % (ref 0.0–3.0)
Eosinophils Absolute: 0.1 10*3/uL (ref 0.0–0.7)
Eosinophils Relative: 2.3 % (ref 0.0–5.0)
HCT: 42.6 % (ref 36.0–46.0)
Hemoglobin: 14.3 g/dL (ref 12.0–15.0)
Lymphocytes Relative: 31 % (ref 12.0–46.0)
Lymphs Abs: 1.9 10*3/uL (ref 0.7–4.0)
MCHC: 33.6 g/dL (ref 30.0–36.0)
MCV: 107.9 fl — ABNORMAL HIGH (ref 78.0–100.0)
Monocytes Absolute: 0.5 10*3/uL (ref 0.1–1.0)
Monocytes Relative: 7.5 % (ref 3.0–12.0)
Neutro Abs: 3.5 10*3/uL (ref 1.4–7.7)
Neutrophils Relative %: 58.2 % (ref 43.0–77.0)
Platelets: 312 10*3/uL (ref 150.0–400.0)
RBC: 3.95 Mil/uL (ref 3.87–5.11)
RDW: 14.4 % (ref 11.5–15.5)
WBC: 6.1 10*3/uL (ref 4.0–10.5)

## 2019-06-20 LAB — LIPID PANEL
Cholesterol: 216 mg/dL — ABNORMAL HIGH (ref 0–200)
HDL: 60.4 mg/dL (ref >39.00)
LDL Cholesterol: 133 mg/dL — ABNORMAL HIGH (ref 0–99)
NonHDL: 156.02
Total CHOL/HDL Ratio: 4
Triglycerides: 115 mg/dL (ref 0.0–149.0)
VLDL: 23 mg/dL (ref 0.0–40.0)

## 2019-06-20 LAB — TSH: TSH: 4.82 u[IU]/mL — ABNORMAL HIGH (ref 0.35–4.50)

## 2019-06-20 LAB — COMPREHENSIVE METABOLIC PANEL
ALT: 31 U/L (ref 0–35)
AST: 38 U/L — ABNORMAL HIGH (ref 0–37)
Albumin: 4.3 g/dL (ref 3.5–5.2)
Alkaline Phosphatase: 94 U/L (ref 39–117)
BUN: 9 mg/dL (ref 6–23)
CO2: 30 mEq/L (ref 19–32)
Calcium: 9.4 mg/dL (ref 8.4–10.5)
Chloride: 99 mEq/L (ref 96–112)
Creatinine, Ser: 0.74 mg/dL (ref 0.40–1.20)
GFR: 83.08 mL/min (ref >60.00)
Glucose, Bld: 96 mg/dL (ref 70–99)
Potassium: 4.6 mEq/L (ref 3.5–5.1)
Sodium: 137 mEq/L (ref 135–145)
Total Bilirubin: 0.5 mg/dL (ref 0.2–1.2)
Total Protein: 6.8 g/dL (ref 6.0–8.3)

## 2019-06-20 MED ORDER — AMPHETAMINE-DEXTROAMPHET ER 20 MG PO CP24: 20.0000 mg | ORAL_CAPSULE | ORAL | 0 refills | Status: DC

## 2019-06-20 MED ORDER — AMPHETAMINE-DEXTROAMPHET ER 20 MG PO CP24
20.0000 mg | ORAL_CAPSULE | ORAL | 0 refills | Status: DC
Start: 2019-06-20 — End: 2019-06-20

## 2019-06-20 MED FILL — ADDERALL XR 20 MG CAP SA: 20 | 30 days supply | Qty: 30 | Fill #0

## 2019-06-20 NOTE — Patient Instructions (Signed)
Please return in 3 months for ADD and HTN.  I will release your lab results to you on your MyChart account with further instructions. Please reply with any questions.    If you have any questions or concerns, please don't hesitate to send me a message via MyChart or call the office at [phone]. Thank you for visiting with [place] today! It's our pleasure caring for you.   Preventive Care 40-64 Years, Female Preventive care refers to lifestyle choices and visits with your health care provider that can promote health and wellness. What does preventive care include?   A yearly physical exam. This is also called an annual well check.  Dental exams once or twice a year.  Routine eye exams. Ask your health care provider how often you should have your eyes checked.  Personal lifestyle choices, including: ? Daily care of your teeth and gums. ? Regular physical activity. ? Eating a healthy diet. ? Avoiding tobacco and drug use. ? Limiting alcohol use. ? Practicing safe sex. ? Taking low-dose aspirin daily starting at age 77. ? Taking vitamin and mineral supplements as recommended by your health care provider. What happens during an annual well check? The services and screenings done by your health care provider during your annual well check will depend on your age, overall health, lifestyle risk factors, and family history of disease. Counseling Your health care provider may ask you questions about your:  Alcohol use.  Tobacco use.  Drug use.  Emotional well-being.  Home and relationship well-being.  Sexual activity.  Eating habits.  Work and work Statistician.  Method of birth control.  Menstrual cycle.  Pregnancy history. Screening You may have the following tests or measurements:  Height, weight, and BMI.  Blood pressure.  Lipid and cholesterol levels. These may be checked every 5 years, or more frequently if you are over 40 years old.  Skin check.  Lung  cancer screening. You may have this screening every year starting at age 12 if you have a 30-pack-year history of smoking and currently smoke or have quit within the past 15 years.  Colorectal cancer screening. All adults should have this screening starting at age 11 and continuing until age 71. Your health care provider may recommend screening at age 74. You will have tests every 1-10 years, depending on your results and the type of screening test. People at increased risk should start screening at an earlier age. Screening tests may include: ? Guaiac-based fecal occult blood testing. ? Fecal immunochemical test (FIT). ? Stool DNA test. ? Virtual colonoscopy. ? Sigmoidoscopy. During this test, a flexible tube with a tiny camera (sigmoidoscope) is used to examine your rectum and lower colon. The sigmoidoscope is inserted through your anus into your rectum and lower colon. ? Colonoscopy. During this test, a long, thin, flexible tube with a tiny camera (colonoscope) is used to examine your entire colon and rectum.  Hepatitis C blood test.  Hepatitis B blood test.  Sexually transmitted disease (STD) testing.  Diabetes screening. This is done by checking your blood sugar (glucose) after you have not eaten for a while (fasting). You may have this done every 1-3 years.  Mammogram. This may be done every 1-2 years. Talk to your health care provider about when you should start having regular mammograms. This may depend on whether you have a family history of breast cancer.  BRCA-related cancer screening. This may be done if you have a family history of breast, ovarian, tubal, or peritoneal  cancers.  Pelvic exam and Pap test. This may be done every 3 years starting at age 21. Starting at age 34, this may be done every 5 years if you have a Pap test in combination with an HPV test.  Bone density scan. This is done to screen for osteoporosis. You may have this scan if you are at high risk for  osteoporosis. Discuss your test results, treatment options, and if necessary, the need for more tests with your health care provider. Vaccines Your health care provider may recommend certain vaccines, such as:  Influenza vaccine. This is recommended every year.  Tetanus, diphtheria, and acellular pertussis (Tdap, Td) vaccine. You may need a Td booster every 10 years.  Varicella vaccine. You may need this if you have not been vaccinated.  Zoster vaccine. You may need this after age 51.  Measles, mumps, and rubella (MMR) vaccine. You may need at least one dose of MMR if you were born in 1957 or later. You may also need a second dose.  Pneumococcal 13-valent conjugate (PCV13) vaccine. You may need this if you have certain conditions and were not previously vaccinated.  Pneumococcal polysaccharide (PPSV23) vaccine. You may need one or two doses if you smoke cigarettes or if you have certain conditions.  Meningococcal vaccine. You may need this if you have certain conditions.  Hepatitis A vaccine. You may need this if you have certain conditions or if you travel or work in places where you may be exposed to hepatitis A.  Hepatitis B vaccine. You may need this if you have certain conditions or if you travel or work in places where you may be exposed to hepatitis B.  Haemophilus influenzae type b (Hib) vaccine. You may need this if you have certain conditions. Talk to your health care provider about which screenings and vaccines you need and how often you need them. This information is not intended to replace advice given to you by your health care provider. Make sure you discuss any questions you have with your health care provider. Document Released: 01/10/2016 Document Revised: 02/03/2018 Document Reviewed: 10/15/2015 Elsevier Interactive Patient Education  2019 [employer].

## 2019-06-20 NOTE — Progress Notes (Signed)
Subjective  Chief Complaint  Patient presents with  . Annual Exam    Had coffee with creamer, has not had mammogram for this year  . ADD    She reports that she has had some life events to take  place and medication is not as effective, thinks it will get better  . Hypertension    Has not been checking her BP at home    HPI: Alison [name] is a 48 y.o. female who presents to [facility] today for a Female Wellness Visit. She also has the concerns and/or needs as listed above in the chief complaint. These will be addressed in addition to the Health Maintenance Visit.   Wellness Visit: annual visit with health maintenance review and exam without Pap; see Dr. Matthew Saras: to have IUD removed this year with mammogram.    HM: feeling well. Sold home and moved to [place] : bought a farm and has 15 acres of land to take care of. Had some stress with move but is doing better now and is adjusting well. Works full time. imms up to date. Pap up to date. Will need crc screen this year. mammo at GYN office Chronic disease f/u and/or acute problem visit: (deemed necessary to be done in addition to the wellness visit):  HTN: doing ok on meds. This am home reading was 115/77. Feeling well. Taking medications w/o adverse effects. No symptoms of CHF, angina; no palpitations, sob, cp or lower extremity edema. Compliant with meds.   ADD: stable on adderall although had a rough spell during move. Not ready to adjust dose: believe things should stabilize again. No AEs. Due UDS and contract in august  BP Readings from Last 3 Encounters:  06/20/19 126/88  11/02/18 120/84  08/08/18 118/80   Wt Readings from Last 3 Encounters:  06/20/19 159 lb (72.1 kg)  05/04/19 156 lb (70.8 kg)  11/02/18 160 lb 3.2 oz (72.7 kg)    Lab Results  Component Value Date   CHOL 221 (H) 05/09/2018   Lab Results  Component Value Date   HDL 83.00 05/09/2018   Lab Results  Component Value Date    LDLCALC 116 (H) 05/09/2018   Lab Results  Component Value Date   TRIG 111.0 05/09/2018   Lab Results  Component Value Date   CHOLHDL 3 05/09/2018   No results found for: LDLDIRECT Lab Results  Component Value Date   CREATININE 0.77 05/09/2018   BUN 15 05/09/2018   NA 137 05/09/2018   K 4.2 05/09/2018   CL 101 05/09/2018   CO2 29 05/09/2018    The 10-year ASCVD risk score Mikey Bussing DC Jr., et al., 2013) is: 1%   Values used to calculate the score:     Age: 43 years     Sex: Female     Is Non-Hispanic African American: No     Diabetic: No     Tobacco smoker: No     Systolic Blood Pressure: 539 mmHg     Is BP treated: Yes     HDL Cholesterol: 83 mg/dL     Total Cholesterol: 221 mg/dL   Assessment  1. Annual physical exam   2. Essential hypertension   3. Attention deficit disorder (ADD) in adult   4. Hot flushes, perimenopausal   5. Goiter      Plan  Female Wellness Visit:  Age appropriate Health Maintenance and Prevention measures were discussed with patient. Included topics are cancer screening recommendations,  ways to keep healthy (see AVS) including dietary and exercise recommendations, regular eye and dental care, use of seat belts, and avoidance of moderate alcohol use and tobacco use.   BMI: discussed patient's BMI and encouraged positive lifestyle modifications to help get to or maintain a target BMI.  HM needs and immunizations were addressed and ordered. See below for orders. See HM and immunization section for updates.  Routine labs and screening tests ordered including cmp, cbc and lipids where appropriate.  Discussed recommendations regarding Vit D and calcium supplementation (see AVS)  Chronic disease management visit and/or acute problem visit:  ADD: doing ok.  Follow up: Return in about 3 months (around 09/20/2019) for follow up Hypertension, follow up on ADD.  Orders Placed This Encounter  Procedures  . CBC with Differential/Platelet  .  Comprehensive metabolic panel  . Lipid panel  . TSH  . FSH   Meds ordered this encounter  Medications  . DISCONTD: amphetamine-dextroamphetamine (ADDERALL XR) 20 MG 24 hr capsule    Sig: Take 1 capsule (20 mg total) by mouth every morning.    Dispense:  30 capsule    Refill:  0  . DISCONTD: amphetamine-dextroamphetamine (ADDERALL XR) 20 MG 24 hr capsule    Sig: Take 1 capsule (20 mg total) by mouth every morning.    Dispense:  30 capsule    Refill:  0  . amphetamine-dextroamphetamine (ADDERALL XR) 20 MG 24 hr capsule    Sig: Take 1 capsule (20 mg total) by mouth every morning.    Dispense:  30 capsule    Refill:  0      Lifestyle: Body mass index is 25.66 kg/m. Wt Readings from Last 3 Encounters:  06/20/19 159 lb (72.1 kg)  05/04/19 156 lb (70.8 kg)  11/02/18 160 lb 3.2 oz (72.7 kg)     Patient Active Problem List   Diagnosis Date Noted  . Essential hypertension 05/09/2018    Priority: High  . Attention deficit disorder (ADD) in adult 05/09/2018    Priority: High  . IUD (intrauterine device) in place 05/09/2018    Priority: Medium    Due out 2020   . DJD (degenerative joint disease), lumbar 05/09/2018    Priority: Medium  . Goiter 06/20/2019   Health Maintenance  Topic Date Due  . MAMMOGRAM  11/27/2018  . INFLUENZA VACCINE  07/29/2019  . PAP SMEAR-Modifier  11/27/2022  . TETANUS/TDAP  01/19/2027  . HIV Screening  Completed   Immunization History  Administered Date(s) Administered  . Influenza-Unspecified 09/27/2016, 09/27/2018  . Tdap 01/19/2017   We updated and reviewed the patient's past history in detail and it is documented below. Allergies: Patient has No Known Allergies. Past Medical History Patient  has a past medical history of Adult ADHD, Degeneration of intervertebral disc of lumbar region, and Hypertension. Past Surgical History Patient  has a past surgical history that includes Tonsillectomy and Lumbar disc surgery (2010). Family History:  Patient family history includes Anxiety disorder in her sister; Atrial fibrillation in her father; COPD in her father; Fibroids in her sister; Goiter in her mother; Healthy in her daughter; Heart disease in her father, maternal uncle, and mother; Hypertension in her father, mother, and sister; Lymphoma in her father; Stroke in her mother. Social History:  Patient  reports that she has quit smoking. Her smoking use included cigarettes. She has never used smokeless tobacco. She reports that she does not drink alcohol or use drugs.  Review of Systems: Constitutional: negative for fever  or malaise Ophthalmic: negative for photophobia, double vision or loss of vision Cardiovascular: negative for chest pain, dyspnea on exertion, or new LE swelling Respiratory: negative for SOB or persistent cough Gastrointestinal: negative for abdominal pain, change in bowel habits or melena Genitourinary: negative for dysuria or gross hematuria, no abnormal uterine bleeding or disharge Musculoskeletal: negative for new gait disturbance or muscular weakness Integumentary: negative for new or persistent rashes, no breast lumps Neurological: negative for TIA or stroke symptoms Psychiatric: negative for SI or delusions Allergic/Immunologic: negative for hives Endocrinology: no tremors, anxiety, cold or heat intolerance: + hot flushes.   Patient Care Team    Relationship Specialty Notifications Start End  Willow OraAndy, Camille L, MD PCP - General Family Medicine  05/09/18   Richarda OverlieHolland, Richard, MD Consulting Physician Obstetrics and Gynecology  05/09/18   Shirlean KellyNudelman, Robert, MD Consulting Physician Neurosurgery  05/09/18     Objective  Vitals: BP 126/88   Pulse 66   Temp 97.8 F (36.6 C) (Oral)   Resp 16   Ht 5\' 6"  (1.676 m)   Wt 159 lb (72.1 kg)   SpO2 99%   BMI 25.66 kg/m  General:  Well developed, well nourished, no acute distress  Psych:  Alert and orientedx3,normal mood and affect HEENT:  Normocephalic, atraumatic,  non-icteric sclera, PERRL, oropharynx is clear without mass or exudate, supple neck without adenopathy, mass, + diffuse mild goiter w/o mass or nodule Cardiovascular:  Normal S1, S2, RRR without gallop, rub or murmur, nondisplaced PMI Respiratory:  Good breath sounds bilaterally, CTAB with normal respiratory effort Gastrointestinal: normal bowel sounds, soft, non-tender, no noted masses. No HSM MSK: no deformities, contusions. Joints are without erythema or swelling. Spine and CVA region are nontender Skin:  Warm, no rashes or suspicious lesions noted Neurologic:    Mental status is normal. CN 2-11 are normal. Gross motor and sensory exams are normal. Normal gait. No tremor    Commons side effects, risks, benefits, and alternatives for medications and treatment plan prescribed today were discussed, and the patient expressed understanding of the given instructions. Patient is instructed to call or message via MyChart if he/she has any questions or concerns regarding our treatment plan. No barriers to understanding were identified. We discussed Red Flag symptoms and signs in detail. Patient expressed understanding regarding what to do in case of urgent or emergency type symptoms.   Medication list was reconciled, printed and provided to the patient in AVS. Patient instructions and summary information was reviewed with the patient as documented in the AVS. This note was prepared with assistance of Dragon voice recognition software. Occasional wrong-word or sound-a-like substitutions may have occurred due to the inherent limitations of voice recognition software

## 2019-07-26 MED FILL — HYDROCHLOROTHIAZIDE 12.5 MG: 12.5 | 90 days supply | Qty: 90 | Fill #3

## 2019-07-26 MED FILL — ADDERALL XR 20 MG CAP SA: 20 | 30 days supply | Qty: 30 | Fill #0

## 2019-07-26 MED FILL — METOPROLOL SUCCINATE ER 50: 50 | 90 days supply | Qty: 90 | Fill #3

## 2019-09-05 ENCOUNTER — Other Ambulatory Visit: Payer: Self-pay | Admitting: *Deleted

## 2019-09-05 ENCOUNTER — Other Ambulatory Visit: Payer: Self-pay | Admitting: Family Medicine

## 2019-09-06 MED FILL — ADDERALL XR 20 MG CAP SA: 20 | 30 days supply | Qty: 30 | Fill #0

## 2019-09-19 ENCOUNTER — Encounter: Payer: Self-pay | Admitting: Family Medicine

## 2019-09-19 ENCOUNTER — Ambulatory Visit (INDEPENDENT_AMBULATORY_CARE_PROVIDER_SITE_OTHER): Payer: No Typology Code available for payment source | Admitting: Family Medicine

## 2019-09-19 ENCOUNTER — Other Ambulatory Visit: Payer: Self-pay | Admitting: Family Medicine

## 2019-09-19 VITALS — BP 106/78 | Wt 155.0 lb

## 2019-09-19 DIAGNOSIS — Z1211 Encounter for screening for malignant neoplasm of colon: Secondary | ICD-10-CM | POA: Diagnosis not present

## 2019-09-19 DIAGNOSIS — R7989 Other specified abnormal findings of blood chemistry: Secondary | ICD-10-CM

## 2019-09-19 DIAGNOSIS — F988 Other specified behavioral and emotional disorders with onset usually occurring in childhood and adolescence: Secondary | ICD-10-CM | POA: Diagnosis not present

## 2019-09-19 DIAGNOSIS — Z1212 Encounter for screening for malignant neoplasm of rectum: Secondary | ICD-10-CM

## 2019-09-19 DIAGNOSIS — I10 Essential (primary) hypertension: Secondary | ICD-10-CM

## 2019-09-19 MED ORDER — AMPHETAMINE-DEXTROAMPHET ER 20 MG PO CP24: 20.0000 mg | ORAL_CAPSULE | ORAL | 0 refills | Status: DC

## 2019-09-19 NOTE — Progress Notes (Signed)
Virtual Visit via Video Note  Subjective  CC:  Chief Complaint  Patient presents with  . Hypertension  . ADD     I connected with Alison [name] on 09/19/19 at  8:20 AM EDT by a video enabled telemedicine application and verified that I am speaking with the correct person using two identifiers. Location patient: Home Location provider: [facility] Persons participating in the virtual visit: Alison [name], Willow Ora, MD Rita Ohara, CMA  I discussed the limitations of evaluation and management by telemedicine and the availability of in person appointments. The patient expressed understanding and agreed to proceed. HPI: Alison [name] is a 47 y.o. female who was contacted today to address the problems listed above in the chief complaint/HTN f/u:  Hypertension f/u: Control is good . Pt reports she is doing well. taking medications as instructed, no medication side effects noted, no TIAs, no chest pain on exertion, no dyspnea on exertion, no swelling of ankles. Checks bp regularly and is always normal. No sxs of hypotension.  She denies adverse effects from his BP medications. Compliance with medication is good.   ADD f/u: doing great on meds. Working from home. Has settled in her new home/farm. Loves it. Due for refills.   Abnormal TSH: minimal elevation when last checked. No sxs of high or low thyroid now.   HM: due CRC screen, avg risk.   Assessment  1. Essential hypertension   2. Screening for colorectal cancer   3. Attention deficit disorder (ADD) in adult      Plan    Hypertension f/u: BP control is well controlled. Continue same meds  ADD f/u: stable. Well controlled. Refilled meds x 1 month. Pt will request refills via mychart when next due.   CRC screen due: education on different options; pt defers colonoscopy and elects cologuard.   abnl TSH: return to office in 3 months to recheck tsh and free T4, T3.   Flu shot per  cone next week.   Follow up: 3 months for f/u ADD and recheck TSH Visit date not found BP Readings from Last 3 Encounters:  09/19/19 106/78  06/20/19 126/88  11/02/18 120/84   Wt Readings from Last 3 Encounters:  09/19/19 155 lb (70.3 kg)  06/20/19 159 lb (72.1 kg)  05/04/19 156 lb (70.8 kg)    Lab Results  Component Value Date   CHOL 216 (H) 06/20/2019   CHOL 221 (H) 05/09/2018   Lab Results  Component Value Date   HDL 60.40 06/20/2019   HDL 83.00 05/09/2018   Lab Results  Component Value Date   LDLCALC 133 (H) 06/20/2019   LDLCALC 116 (H) 05/09/2018   Lab Results  Component Value Date   TRIG 115.0 06/20/2019   TRIG 111.0 05/09/2018   Lab Results  Component Value Date   CHOLHDL 4 06/20/2019   CHOLHDL 3 05/09/2018   No results found for: LDLDIRECT Lab Results  Component Value Date   CREATININE 0.74 06/20/2019   BUN 9 06/20/2019   NA 137 06/20/2019   K 4.6 06/20/2019   CL 99 06/20/2019   CO2 30 06/20/2019    The 10-year ASCVD risk score Denman George DC Jr., et al., 2013) is: 1.1%   Values used to calculate the score:     Age: 21 years     Sex: Female     Is Non-Hispanic African American: No     Diabetic: No  Tobacco smoker: No     Systolic Blood Pressure: 614 mmHg     Is BP treated: Yes     HDL Cholesterol: 60.4 mg/dL     Total Cholesterol: 216 mg/dL   Meds ordered this encounter  Medications  . amphetamine-dextroamphetamine (ADDERALL XR) 20 MG 24 hr capsule    Sig: Take 1 capsule (20 mg total) by mouth every morning.    Dispense:  30 capsule    Refill:  0      I reviewed the patients updated PMH, FH, and SocHx.    Patient Active Problem List   Diagnosis Date Noted  . Essential hypertension 05/09/2018    Priority: High  . Attention deficit disorder (ADD) in adult 05/09/2018    Priority: High  . IUD (intrauterine device) in place 05/09/2018    Priority: Medium  . DJD (degenerative joint disease), lumbar 05/09/2018    Priority: Medium  .  Goiter 06/20/2019   Current Meds  Medication Sig  . amphetamine-dextroamphetamine (ADDERALL XR) 20 MG 24 hr capsule Take 1 capsule (20 mg total) by mouth every morning.  . hydrochlorothiazide (MICROZIDE) 12.5 MG capsule Take 1 capsule (12.5 mg total) by mouth daily.  . metoprolol succinate (TOPROL-XL) 50 MG 24 hr tablet Take 1 tablet (50 mg total) by mouth daily.  . [DISCONTINUED] amphetamine-dextroamphetamine (ADDERALL XR) 20 MG 24 hr capsule Take 1 capsule (20 mg total) by mouth every morning.    Allergies: Patient has No Known Allergies. Family History: Patient family history includes Anxiety disorder in her sister; Atrial fibrillation in her father; COPD in her father; Fibroids in her sister; Goiter in her mother; Healthy in her daughter; Heart disease in her father, maternal uncle, and mother; Hypertension in her father, mother, and sister; Lymphoma in her father; Stroke in her mother. Social History:  Patient  reports that she has quit smoking. Her smoking use included cigarettes. She has never used smokeless tobacco. She reports that she does not drink alcohol or use drugs.  Review of Systems: Constitutional: Negative for fever malaise or anorexia Cardiovascular: negative for chest pain Respiratory: negative for SOB or persistent cough Gastrointestinal: negative for abdominal pain  OBJECTIVE Vitals: BP 106/78   Wt 155 lb (70.3 kg)   BMI 25.02 kg/m  General: no acute distress , A&Ox3  Leamon Arnt, MD

## 2019-10-02 DIAGNOSIS — F909 Attention-deficit hyperactivity disorder, unspecified type: Secondary | ICD-10-CM | POA: Insufficient documentation

## 2019-10-02 DIAGNOSIS — A630 Anogenital (venereal) warts: Secondary | ICD-10-CM | POA: Insufficient documentation

## 2019-10-02 DIAGNOSIS — M8580 Other specified disorders of bone density and structure, unspecified site: Secondary | ICD-10-CM | POA: Insufficient documentation

## 2019-10-05 MED FILL — ADDERALL XR 20 MG CAP SA: 20 | 30 days supply | Qty: 30 | Fill #0

## 2019-10-09 ENCOUNTER — Encounter: Payer: Self-pay | Admitting: Family Medicine

## 2019-10-09 LAB — COLOGUARD: Cologuard: NEGATIVE

## 2019-10-12 ENCOUNTER — Encounter: Payer: Self-pay | Admitting: Family Medicine

## 2019-11-01 ENCOUNTER — Other Ambulatory Visit: Payer: Self-pay | Admitting: Family Medicine

## 2019-11-03 ENCOUNTER — Other Ambulatory Visit: Payer: Self-pay | Admitting: Family Medicine

## 2019-11-03 MED FILL — HYDROCHLOROTHIAZIDE 12.5 MG: 12.5 | 90 days supply | Qty: 90 | Fill #0

## 2019-11-03 MED FILL — METOPROLOL SUCCINATE ER 50: 50 | 90 days supply | Qty: 90 | Fill #0

## 2019-11-06 ENCOUNTER — Other Ambulatory Visit: Payer: Self-pay | Admitting: Family Medicine

## 2019-11-06 MED ORDER — AMPHETAMINE-DEXTROAMPHET ER 20 MG PO CP24: 20.0000 mg | ORAL_CAPSULE | ORAL | 0 refills | Status: DC

## 2019-11-06 MED FILL — ADDERALL XR 20 MG CAP SA: 20 | 30 days supply | Qty: 30 | Fill #0

## 2019-11-06 NOTE — Telephone Encounter (Signed)
Last OV:  09/19/2019 Next OV:  Not scheduled. Last Fill:  10/05/2019  Please advise.

## 2019-11-06 NOTE — Telephone Encounter (Signed)
Pt stated the portal to request refills is not helpful and she is frustrated that her Rx has not been approved. Pt stated she lives 30 minutes from her pharmacy and she would like Dr. Jonni Sanger to call her back. Cb# [phone]

## 2019-11-06 NOTE — Telephone Encounter (Signed)
See note

## 2019-11-06 NOTE — Telephone Encounter (Signed)
amphetamine-dextroamphetamine (ADDERALL XR) 20 MG 24 hr capsule    Patient requesting a refill. Patient states she is on her way to pharmacy.    [place], [place] - [place] [phone] (Phone) [phone] (Fax)

## 2019-11-07 NOTE — Telephone Encounter (Signed)
Rx was already sent

## 2019-12-11 ENCOUNTER — Other Ambulatory Visit: Payer: Self-pay | Admitting: Family Medicine

## 2019-12-11 MED ORDER — AMPHETAMINE-DEXTROAMPHET ER 20 MG PO CP24: 20.0000 mg | ORAL_CAPSULE | ORAL | 0 refills | Status: DC

## 2019-12-11 MED FILL — ADDERALL XR 20 MG CAP SA: 20 | 30 days supply | Qty: 30 | Fill #0

## 2019-12-11 NOTE — Telephone Encounter (Signed)
Rx Request 

## 2019-12-11 NOTE — Telephone Encounter (Signed)
Called pt to schedule, no answer, LVM.  °

## 2019-12-11 NOTE — Telephone Encounter (Signed)
Please call pt: i've refilled her adderall XR for this month. She is due for a follow up visit for ADD. Please schedule. May be virtual if she prefers. No further refills w/o OV> thanks.

## 2020-01-15 ENCOUNTER — Other Ambulatory Visit: Payer: Self-pay | Admitting: Family Medicine

## 2020-01-15 NOTE — Telephone Encounter (Signed)
  LAST APPOINTMENT DATE: 09/19/2019  NEXT APPOINTMENT DATE:@Visit  date not found  MEDICATION: Adderall XR 20 MG 24 hr capsule  PHARMACY: [facility]    LAST REFILL: 12/11/2019  QTY: 30  REFILLS: 0    OTHER COMMENTS:    Okay for refill?  Please advise

## 2020-01-16 NOTE — Telephone Encounter (Signed)
Left voicemail to get scheduled for a medication refill.

## 2020-01-16 NOTE — Telephone Encounter (Signed)
Pt is overdue for f/u visit for ADD. Needs appt. Medication not refilled. She was informed last month of this. Please call her to get her scheduled. Thanks. Can be virtual visit.

## 2020-01-22 ENCOUNTER — Encounter: Payer: Self-pay | Admitting: Family Medicine

## 2020-01-25 NOTE — Telephone Encounter (Signed)
Called and scheduled virtual visit at 10 AM 1/29

## 2020-01-26 ENCOUNTER — Encounter: Payer: Self-pay | Admitting: Family Medicine

## 2020-01-26 ENCOUNTER — Ambulatory Visit (INDEPENDENT_AMBULATORY_CARE_PROVIDER_SITE_OTHER): Payer: No Typology Code available for payment source | Admitting: Family Medicine

## 2020-01-26 VITALS — BP 117/80 | HR 67 | Ht 66.0 in | Wt 155.0 lb

## 2020-01-26 DIAGNOSIS — F988 Other specified behavioral and emotional disorders with onset usually occurring in childhood and adolescence: Secondary | ICD-10-CM

## 2020-01-26 DIAGNOSIS — I10 Essential (primary) hypertension: Secondary | ICD-10-CM | POA: Diagnosis not present

## 2020-01-26 DIAGNOSIS — Z975 Presence of (intrauterine) contraceptive device: Secondary | ICD-10-CM | POA: Diagnosis not present

## 2020-01-26 DIAGNOSIS — R7989 Other specified abnormal findings of blood chemistry: Secondary | ICD-10-CM | POA: Diagnosis not present

## 2020-01-26 MED ORDER — AMPHETAMINE-DEXTROAMPHET ER 20 MG PO CP24: 20.0000 mg | ORAL_CAPSULE | ORAL | 0 refills | Status: DC

## 2020-01-26 MED FILL — ADDERALL XR 20 MG CAP SA: 20 | 30 days supply | Qty: 30 | Fill #0

## 2020-01-26 MED FILL — METOPROLOL SUCCINATE ER 50: 50 | 90 days supply | Qty: 90 | Fill #1

## 2020-01-26 MED FILL — HYDROCHLOROTHIAZIDE 12.5 MG: 12.5 | 90 days supply | Qty: 90 | Fill #1

## 2020-01-26 NOTE — Patient Instructions (Signed)
Please return in June 2021 for your annual complete physical; please come fasting.   If you have any questions or concerns, please don't hesitate to send me a message via MyChart or call the office at [phone]. Thank you for visiting with [place] today! It's our pleasure caring for you.

## 2020-01-26 NOTE — Progress Notes (Signed)
Virtual Visit via Video Note  Subjective  CC:  Chief Complaint  Patient presents with  . ADD    Patient needs medication refills. Adderrall helps patient. Patient wants to keep current dose instead of increasing dose because she is afraid of side effects.  . Hypertension    Patient needs refills. BP readings are stable.      I connected with Alison [name] on 01/26/20 at 10:00 AM EST by a video enabled telemedicine application and verified that I am speaking with the correct person using two identifiers. Location patient: Home Location provider: [facility] [address], Office Persons participating in the virtual visit: Alison [name], Willow Ora, MD Gracy Racer, [place]  I discussed the limitations of evaluation and management by telemedicine and the availability of in person appointments. The patient expressed understanding and agreed to proceed. HPI: Alison [name] is a 49 y.o. female who was contacted today to address the problems listed above in the chief complaint.  Hypertension f/u: Control is good . Pt reports she is doing well. taking medications as instructed, no medication side effects noted, no TIAs, no chest pain on exertion, no dyspnea on exertion, no swelling of ankles. She denies adverse effects from his BP medications. Compliance with medication is good.   ADD: well controlled on current dose. No concerns. Due refill. I reviewed patient's records from the PMP aware controlled substance registry today.   Mildly elevated TSH in June 2020: pt prefers virtual visits due to covid at this time. No sxs of low thyroid.   HM: updated mammo; saw Dr. Marcelle Overlie for female wellness in December. imms up to date;had 1st covid vaccine.   BP Readings from Last 3 Encounters:  01/26/20 117/80  09/19/19 106/78  06/20/19 126/88   Wt Readings from Last 3 Encounters:  01/26/20 155 lb (70.3 kg)  09/19/19 155 lb (70.3 kg)  06/20/19 159 lb (72.1 kg)    Lab  Results  Component Value Date   CHOL 216 (H) 06/20/2019   CHOL 221 (H) 05/09/2018   Lab Results  Component Value Date   HDL 60.40 06/20/2019   HDL 83.00 05/09/2018   Lab Results  Component Value Date   LDLCALC 133 (H) 06/20/2019   LDLCALC 116 (H) 05/09/2018   Lab Results  Component Value Date   TRIG 115.0 06/20/2019   TRIG 111.0 05/09/2018   Lab Results  Component Value Date   CHOLHDL 4 06/20/2019   CHOLHDL 3 05/09/2018   No results found for: LDLDIRECT Lab Results  Component Value Date   CREATININE 0.74 06/20/2019   BUN 9 06/20/2019   NA 137 06/20/2019   K 4.6 06/20/2019   CL 99 06/20/2019   CO2 30 06/20/2019    The 10-year ASCVD risk score Denman George DC Jr., et al., 2013) is: 1.4%   Values used to calculate the score:     Age: 49 years     Sex: Female     Is Non-Hispanic African American: No     Diabetic: No     Tobacco smoker: No     Systolic Blood Pressure: 117 mmHg     Is BP treated: Yes     HDL Cholesterol: 60.4 mg/dL     Total Cholesterol: 216 mg/dL  Assessment  1. Essential hypertension   2. Attention deficit disorder (ADD) in adult   3. Abnormal TSH      Plan   HTN:  This medical condition  is well controlled. There are no signs of complications, medication side effects, or red flags. Patient is instructed to continue the current treatment plan without change in therapies or medications.   ADD: This medical condition is well controlled. There are no signs of complications, medication side effects, or red flags. Patient is instructed to continue the current treatment plan without change in therapies or medications. meds refilled x 3 months. She will call with next due refill.   abnl tsh: will recheck in June with her cpe.  I discussed the assessment and treatment plan with the patient. The patient was provided an opportunity to ask questions and all were answered. The patient agreed with the plan and demonstrated an understanding of the instructions.    The patient was advised to call back or seek an in-person evaluation if the symptoms worsen or if the condition fails to improve as anticipated. Follow up: Return in about 5 months (around 06/25/2020) for complete physical, follow up on ADD.  Visit date not found  No orders of the defined types were placed in this encounter.     I reviewed the patients updated PMH, FH, and SocHx.    Patient Active Problem List   Diagnosis Date Noted  . Essential hypertension 05/09/2018    Priority: High  . Attention deficit disorder (ADD) in adult 05/09/2018    Priority: High  . IUD (intrauterine device) in place 05/09/2018    Priority: Medium  . DJD (degenerative joint disease), lumbar 05/09/2018    Priority: Medium  . Attention deficit hyperactivity disorder 10/02/2019  . Genital warts 10/02/2019  . Osteopenia 10/02/2019  . Goiter 06/20/2019   Current Meds  Medication Sig  . amphetamine-dextroamphetamine (ADDERALL XR) 20 MG 24 hr capsule Take 1 capsule (20 mg total) by mouth every morning.  . hydrochlorothiazide (MICROZIDE) 12.5 MG capsule TAKE 1 CAPSULE BY MOUTH DAILY.  Marland Kitchen levonorgestrel (MIRENA, 52 MG,) 20 MCG/24HR IUD 20 Intra Uterine Devices by Intrauterine route daily.  . metoprolol succinate (TOPROL-XL) 50 MG 24 hr tablet TAKE 1 TABLET (50 MG TOTAL) BY MOUTH DAILY.    Allergies: Patient has No Known Allergies. Family History: Patient family history includes Anxiety disorder in her sister; Atrial fibrillation in her father; COPD in her father; Fibroids in her sister; Goiter in her mother; Healthy in her daughter; Heart disease in her father, maternal uncle, and mother; Hypertension in her father, mother, and sister; Lymphoma in her father; Stroke in her mother. Social History:  Patient  reports that she has quit smoking. Her smoking use included cigarettes. She has never used smokeless tobacco. She reports that she does not drink alcohol or use drugs.  Review of Systems: Constitutional:  Negative for fever malaise or anorexia Cardiovascular: negative for chest pain Respiratory: negative for SOB or persistent cough Gastrointestinal: negative for abdominal pain  OBJECTIVE Vitals: BP 117/80   Pulse 67   Ht 5\' 6"  (1.676 m)   Wt 155 lb (70.3 kg)   BMI 25.02 kg/m  General: no acute distress , A&Ox3  Leamon Arnt, MD

## 2020-02-27 MED FILL — ADDERALL XR 20 MG CAP SA: 20 | 30 days supply | Qty: 30 | Fill #0

## 2020-04-02 MED FILL — ADDERALL XR 20 MG CAP SA: 20 | 30 days supply | Qty: 30 | Fill #0

## 2020-04-30 ENCOUNTER — Other Ambulatory Visit: Payer: Self-pay | Admitting: Family Medicine

## 2020-04-30 MED FILL — ADDERALL XR 20 MG CAP SA: 20 | 30 days supply | Qty: 30 | Fill #0

## 2020-04-30 MED FILL — METOPROLOL SUCCINATE ER 50: 50 | 90 days supply | Qty: 90 | Fill #2

## 2020-04-30 MED FILL — HYDROCHLOROTHIAZIDE 12.5 MG: 12.5 | 90 days supply | Qty: 90 | Fill #2

## 2020-04-30 NOTE — Telephone Encounter (Signed)
Medication filled.  

## 2020-04-30 NOTE — Telephone Encounter (Signed)
Refilled adderall for 30 days.  Pt is due cpe visit in June. Please call to schedule. Will need in person visit for HTN, thyroid and ADD visit f/u IF no CPE visit available at that time. No further refills w/o in person visit and lab work. Thank you.

## 2020-04-30 NOTE — Telephone Encounter (Signed)
    LAST APPOINTMENT DATE: 01/26/2020   NEXT APPOINTMENT DATE:@5 /18/2021  MEDICATION:ADDERALL XR 20 MG 24 hr capsule  PHARMACY: [facility] - [place], [place] - [address] [place] Phone:  [phone]  Fax:  [phone]          Patient states that she have 2 day supply for ADDERALL XR 20 MG 24 hr capsule. Patient would like to know will she need to fast for any labs for her upcoming appointment   **Let patient know to contact pharmacy at the end of the day to make sure medication is ready. **  ** Please notify patient to allow 48-72 hours to process**  **Encourage patient to contact the pharmacy for refills or they can request refills through [facility]**  CLINICAL FILLS OUT ALL BELOW:   LAST REFILL:  QTY:  REFILL DATE:    OTHER COMMENTS:    Okay for refill?  Please advise

## 2020-05-14 ENCOUNTER — Other Ambulatory Visit: Payer: Self-pay

## 2020-05-14 ENCOUNTER — Encounter: Payer: Self-pay | Admitting: Family Medicine

## 2020-05-14 ENCOUNTER — Ambulatory Visit (INDEPENDENT_AMBULATORY_CARE_PROVIDER_SITE_OTHER): Payer: No Typology Code available for payment source | Admitting: Family Medicine

## 2020-05-14 VITALS — BP 138/82 | HR 66 | Temp 97.8°F | Resp 15 | Ht 66.0 in | Wt 154.2 lb

## 2020-05-14 DIAGNOSIS — R7989 Other specified abnormal findings of blood chemistry: Secondary | ICD-10-CM

## 2020-05-14 DIAGNOSIS — F900 Attention-deficit hyperactivity disorder, predominantly inattentive type: Secondary | ICD-10-CM | POA: Diagnosis not present

## 2020-05-14 DIAGNOSIS — Z0000 Encounter for general adult medical examination without abnormal findings: Secondary | ICD-10-CM | POA: Diagnosis not present

## 2020-05-14 DIAGNOSIS — Z975 Presence of (intrauterine) contraceptive device: Secondary | ICD-10-CM

## 2020-05-14 DIAGNOSIS — E049 Nontoxic goiter, unspecified: Secondary | ICD-10-CM | POA: Diagnosis not present

## 2020-05-14 DIAGNOSIS — I10 Essential (primary) hypertension: Secondary | ICD-10-CM | POA: Diagnosis not present

## 2020-05-14 DIAGNOSIS — M8580 Other specified disorders of bone density and structure, unspecified site: Secondary | ICD-10-CM

## 2020-05-14 DIAGNOSIS — F320 Major depressive disorder, single episode, mild: Secondary | ICD-10-CM

## 2020-05-14 DIAGNOSIS — N951 Menopausal and female climacteric states: Secondary | ICD-10-CM

## 2020-05-14 LAB — COMPREHENSIVE METABOLIC PANEL
ALT: 43 U/L — ABNORMAL HIGH (ref 0–35)
AST: 51 U/L — ABNORMAL HIGH (ref 0–37)
Albumin: 4.7 g/dL (ref 3.5–5.2)
Alkaline Phosphatase: 86 U/L (ref 39–117)
BUN: 10 mg/dL (ref 6–23)
CO2: 31 mEq/L (ref 19–32)
Calcium: 9.5 mg/dL (ref 8.4–10.5)
Chloride: 99 mEq/L (ref 96–112)
Creatinine, Ser: 0.84 mg/dL (ref 0.40–1.20)
GFR: 71.51 mL/min (ref >60.00)
Glucose, Bld: 104 mg/dL — ABNORMAL HIGH (ref 70–99)
Potassium: 4 mEq/L (ref 3.5–5.1)
Sodium: 137 mEq/L (ref 135–145)
Total Bilirubin: 0.8 mg/dL (ref 0.2–1.2)
Total Protein: 7.4 g/dL (ref 6.0–8.3)

## 2020-05-14 LAB — LIPID PANEL
Cholesterol: 244 mg/dL — ABNORMAL HIGH (ref 0–200)
HDL: 59.6 mg/dL (ref >39.00)
LDL Cholesterol: 156 mg/dL — ABNORMAL HIGH (ref 0–99)
NonHDL: 184.01
Total CHOL/HDL Ratio: 4
Triglycerides: 141 mg/dL (ref 0.0–149.0)
VLDL: 28.2 mg/dL (ref 0.0–40.0)

## 2020-05-14 LAB — CBC WITH DIFFERENTIAL/PLATELET
Basophils Absolute: 0 10*3/uL (ref 0.0–0.1)
Basophils Relative: 0.8 % (ref 0.0–3.0)
Eosinophils Absolute: 0.1 10*3/uL (ref 0.0–0.7)
Eosinophils Relative: 1.9 % (ref 0.0–5.0)
HCT: 43.4 % (ref 36.0–46.0)
Hemoglobin: 15.2 g/dL — ABNORMAL HIGH (ref 12.0–15.0)
Lymphocytes Relative: 28.3 % (ref 12.0–46.0)
Lymphs Abs: 1.7 10*3/uL (ref 0.7–4.0)
MCHC: 35 g/dL (ref 30.0–36.0)
MCV: 107.4 fl — ABNORMAL HIGH (ref 78.0–100.0)
Monocytes Absolute: 0.4 10*3/uL (ref 0.1–1.0)
Monocytes Relative: 7.5 % (ref 3.0–12.0)
Neutro Abs: 3.6 10*3/uL (ref 1.4–7.7)
Neutrophils Relative %: 61.5 % (ref 43.0–77.0)
Platelets: 246 10*3/uL (ref 150.0–400.0)
RBC: 4.04 Mil/uL (ref 3.87–5.11)
RDW: 13.9 % (ref 11.5–15.5)
WBC: 5.9 10*3/uL (ref 4.0–10.5)

## 2020-05-14 LAB — T3: T3, Total: 107 ng/dL (ref 76–181)

## 2020-05-14 MED ORDER — ESCITALOPRAM OXALATE 10 MG PO TABS: 10.0000 mg | ORAL_TABLET | Freq: Every day | ORAL | 2 refills | Status: DC

## 2020-05-14 MED ORDER — AMPHETAMINE-DEXTROAMPHET ER 20 MG PO CP24: 20.0000 mg | ORAL_CAPSULE | Freq: Every day | ORAL | 0 refills | Status: DC

## 2020-05-14 MED FILL — ESCITALOPRAM 10 MG TABLET: 10 | 30 days supply | Qty: 30 | Fill #0

## 2020-05-14 NOTE — Progress Notes (Signed)
Subjective  Chief Complaint  Patient presents with  . Hypertension    random BP checks at home 120/80's  . TSH    elevated at last check  . Annual Exam    HPI: Alison [name] is a 49 y.o. female who presents to [facility] today for a Female Wellness Visit. She also has the concerns and/or needs as listed above in the chief complaint. These will be addressed in addition to the Health Maintenance Visit.   Wellness Visit: annual visit with health maintenance review and exam without Pap   HM: up to date on pap and cologuard. IUD in place and good for another year. Last fsh was perimenopausal range and admits to some sxs - occ hot flush but having mood lability. See below. Saw GYN female wellness in dec 2020. Chronic disease f/u and/or acute problem visit: (deemed necessary to be done in addition to the wellness visit):  HTN: has been well controlled at home on meds. Home readings 120s/80 on avg. Feeling well. Taking medications w/o adverse effects. No symptoms of CHF, angina; no palpitations, sob, cp or lower extremity edema. Compliant with meds.   ADD: remains well controlled on once daily adderall xr 20. I reviewed patient's records from the PMP aware controlled substance registry today.  No AEs  Mood: concerned about depressive sxs: feels down, more sensitive and cries easily (which is unusual for her). Less energetic and enjoying life less. Has a farm and a horse: harder to go out and ride etc. Doing fine at work. Reports home stress due to 37 yo daughter and boyfriend who live with her. Is interetsted in starting meds. Only once before in her life was she thinking about starting medication for a stress related depressive episode but ended up not needing medications. Her daughter has a mood disorder.   Goiter w/o mass effect sxs. No sxs of palpitations or heat/cold intolerance but does have low energy as noted above.   H/o ostepenia dxd by gyn.   IUD in  place and amenorrheic.    Assessment  1. Annual physical exam   2. Essential hypertension   3. Attention deficit hyperactivity disorder (ADHD), predominantly inattentive type   4. Goiter   5. Osteopenia, unspecified location   6. Depression, major, single episode, mild ([place])   7. Perimenopause   8. IUD (intrauterine device) in place      Plan  Female Wellness Visit:  Age appropriate Health Maintenance and Prevention measures were discussed with patient. Included topics are cancer screening recommendations, ways to keep healthy (see AVS) including dietary and exercise recommendations, regular eye and dental care, use of seat belts, and avoidance of moderate alcohol use and tobacco use. Screens are up to date  BMI: discussed patient's BMI and encouraged positive lifestyle modifications to help get to or maintain a target BMI.  HM needs and immunizations were addressed and ordered. See below for orders. See HM and immunization section for updates.  Routine labs and screening tests ordered including cmp, cbc and lipids where appropriate.  Discussed recommendations regarding Vit D and calcium supplementation (see AVS)  Chronic disease management visit and/or acute problem visit:  HTN: good control. Check renal function and electrolytes. nonfasting today  ADD: good control. Refilled meds x 6 months  Goiter with mild elevation in tsh last year: recheck levels today. Consider ultrasound to ensure no significant nodules. Clinically stable.   Depressive episode: multifactorial: situational, perimenopausal and clinical. Start lexapro. Education  given. Recheck 6 weeks  IUD in place. Continue until 2022.   Follow up: Return in about 8 weeks (around 07/09/2020) for mood follow up.  Orders Placed This Encounter  Procedures  . Comprehensive metabolic panel  . CBC with Differential/Platelet  . Lipid panel  . TSH  . T3  . T4, free   Meds ordered this encounter  Medications  .  escitalopram (LEXAPRO) 10 MG tablet    Sig: Take 1 tablet (10 mg total) by mouth daily.    Dispense:  30 tablet    Refill:  2  . DISCONTD: amphetamine-dextroamphetamine (ADDERALL XR) 20 MG 24 hr capsule    Sig: Take 1 capsule (20 mg total) by mouth daily.    Dispense:  30 capsule    Refill:  0    Please keep on file for next refill due June  . DISCONTD: amphetamine-dextroamphetamine (ADDERALL XR) 20 MG 24 hr capsule    Sig: Take 1 capsule (20 mg total) by mouth daily.    Dispense:  30 capsule    Refill:  0    Please keep on file for next refill due July  . DISCONTD: amphetamine-dextroamphetamine (ADDERALL XR) 20 MG 24 hr capsule    Sig: Take 1 capsule (20 mg total) by mouth daily.    Dispense:  30 capsule    Refill:  0    Please keep on file for next refill due August  . DISCONTD: amphetamine-dextroamphetamine (ADDERALL XR) 20 MG 24 hr capsule    Sig: Take 1 capsule (20 mg total) by mouth daily.    Dispense:  30 capsule    Refill:  0    Please keep on file for next refill due September  . amphetamine-dextroamphetamine (ADDERALL XR) 20 MG 24 hr capsule    Sig: Take 1 capsule (20 mg total) by mouth daily.    Dispense:  30 capsule    Refill:  0    Please keep on file for next refill due October      Lifestyle: Body mass index is 24.89 kg/m. Wt Readings from Last 3 Encounters:  05/14/20 154 lb 3.2 oz (69.9 kg)  01/26/20 155 lb (70.3 kg)  09/19/19 155 lb (70.3 kg)     Patient Active Problem List   Diagnosis Date Noted  . Attention deficit hyperactivity disorder 10/02/2019    Priority: High  . Essential hypertension 05/09/2018    Priority: High  . Osteopenia 10/02/2019    Priority: Medium  . IUD (intrauterine device) in place 05/09/2018    Priority: Medium    Due out 2022   . DJD (degenerative joint disease), lumbar 05/09/2018    Priority: Medium  . Genital warts 10/02/2019    Priority: Low  . Goiter 06/20/2019    Priority: Low  . Depression, major, single  episode, mild ([facility]) 05/14/2020   Health Maintenance  Topic Date Due  . INFLUENZA VACCINE  07/28/2020  . MAMMOGRAM  12/17/2020  . Fecal DNA (Cologuard)  09/28/2022  . PAP SMEAR-Modifier  11/27/2022  . TETANUS/TDAP  01/19/2027  . COVID-19 Vaccine  Completed  . HIV Screening  Completed   Immunization History  Administered Date(s) Administered  . Influenza-Unspecified 09/27/2016, 09/27/2018  . PFIZER SARS-COV-2 Vaccination 01/11/2020, 02/01/2020  . Tdap 01/19/2017   We updated and reviewed the patient's past history in detail and it is documented below. Allergies: Patient has No Known Allergies. Past Medical History Patient  has a past medical history of Adult ADHD, Degeneration of intervertebral disc  of lumbar region, and Hypertension. Past Surgical History Patient  has a past surgical history that includes Tonsillectomy and Lumbar disc surgery (2010). Family History: Patient family history includes Anxiety disorder in her sister; Atrial fibrillation in her father; COPD in her father; Fibroids in her sister; Goiter in her mother; Healthy in her daughter; Heart disease in her father, maternal uncle, and mother; Hypertension in her father, mother, and sister; Lymphoma in her father; Stroke in her mother. Social History:  Patient  reports that she has quit smoking. Her smoking use included cigarettes. She has never used smokeless tobacco. She reports that she does not drink alcohol or use drugs.  Review of Systems: Constitutional: negative for fever or malaise Ophthalmic: negative for photophobia, double vision or loss of vision Cardiovascular: negative for chest pain, dyspnea on exertion, or new LE swelling Respiratory: negative for SOB or persistent cough Gastrointestinal: negative for abdominal pain, change in bowel habits or melena Genitourinary: negative for dysuria or gross hematuria, no abnormal uterine bleeding or disharge Musculoskeletal: negative for new gait disturbance or  muscular weakness Integumentary: negative for new or persistent rashes, no breast lumps Neurological: negative for TIA or stroke symptoms Psychiatric: negative for SI or delusions Allergic/Immunologic: negative for hives  Patient Care Team    Relationship Specialty Notifications Start End  Willow Ora, MD PCP - General Family Medicine  05/09/18   Richarda Overlie, MD Consulting Physician Obstetrics and Gynecology  05/09/18   Shirlean Kelly, MD Consulting Physician Neurosurgery  05/09/18     Objective  Vitals: BP 138/82   Pulse 66   Temp 97.8 F (36.6 C) (Temporal)   Resp 15   Ht 5\' 6"  (1.676 m)   Wt 154 lb 3.2 oz (69.9 kg)   SpO2 97%   BMI 24.89 kg/m  General:  Well developed, well nourished, no acute distress  Psych:  Alert and orientedx3,normal mood and affect HEENT:  Normocephalic, atraumatic, non-icteric sclera,  supple neck without adenopathy, mass or thyromegaly Cardiovascular:  Normal S1, S2, RRR without gallop, rub or murmur Respiratory:  Good breath sounds bilaterally, CTAB with normal respiratory effort Gastrointestinal: normal bowel sounds, soft, non-tender, no noted masses. No HSM MSK: no deformities, contusions. Joints are without erythema or swelling.  Skin:  Warm, no rashes or suspicious lesions noted Neurologic:    Mental status is normal. CN 2-11 are normal. Gross motor and sensory exams are normal. Normal gait. No tremor   Commons side effects, risks, benefits, and alternatives for medications and treatment plan prescribed today were discussed, and the patient expressed understanding of the given instructions. Patient is instructed to call or message via MyChart if he/she has any questions or concerns regarding our treatment plan. No barriers to understanding were identified. We discussed Red Flag symptoms and signs in detail. Patient expressed understanding regarding what to do in case of urgent or emergency type symptoms.   Medication list was reconciled,  printed and provided to the patient in AVS. Patient instructions and summary information was reviewed with the patient as documented in the AVS. This note was prepared with assistance of Dragon voice recognition software. Occasional wrong-word or sound-a-like substitutions may have occurred due to the inherent limitations of voice recognition software  This visit occurred during the SARS-CoV-2 public health emergency.  Safety protocols were in place, including screening questions prior to the visit, additional usage of staff PPE, and extensive cleaning of exam room while observing appropriate contact time as indicated for disinfecting solutions.

## 2020-05-14 NOTE — Patient Instructions (Signed)
Please return in 6-8 weeks to recheck mood on meds.  I will release your lab results to you on your MyChart account with further instructions. Please reply with any questions.   I have refilled your ADD medications fro 6 months.  If you have any questions or concerns, please don't hesitate to send me a message via MyChart or call the office at [phone]. Thank you for visiting with [place] today! It's our pleasure caring for you.   Perimenopause  Perimenopause is the normal time of life before and after menstrual periods stop completely (menopause). Perimenopause can begin 2-8 years before menopause, and it usually lasts for 1 year after menopause. During perimenopause, the ovaries may or may not produce an egg. What are the causes? This condition is caused by a natural change in hormone levels that happens as you get older. What increases the risk? This condition is more likely to start at an earlier age if you have certain medical conditions or treatments, including:  A tumor of the pituitary gland in the brain.  A disease that affects the ovaries and hormone production.  Radiation treatment for cancer.  Certain cancer treatments, such as chemotherapy or hormone (anti-estrogen) therapy.  Heavy smoking and excessive alcohol use.  Family history of early menopause. What are the signs or symptoms? Perimenopausal changes affect each woman differently. Symptoms of this condition may include:  Hot flashes.  Night sweats.  Irregular menstrual periods.  Decreased sex drive.  Vaginal dryness.  Headaches.  Mood swings.  Depression.  Memory problems or trouble concentrating.  Irritability.  Tiredness.  Weight gain.  Anxiety.  Trouble getting pregnant. How is this diagnosed? This condition is diagnosed based on your medical history, a physical exam, your age, your menstrual history, and your symptoms. Hormone tests may also be done. How is this treated? In some cases,  no treatment is needed. You and your health care provider should make a decision together about whether treatment is necessary. Treatment will be based on your individual condition and preferences. Various treatments are available, such as:  Menopausal hormone therapy (MHT).  Medicines to treat specific symptoms.  Acupuncture.  Vitamin or herbal supplements. Before starting treatment, make sure to let your health care provider know if you have a personal or family history of:  Heart disease.  Breast cancer.  Blood clots.  Diabetes.  Osteoporosis. Follow these instructions at home: Lifestyle  Do not use any products that contain nicotine or tobacco, such as cigarettes and e-cigarettes. If you need help quitting, ask your health care provider.  Eat a balanced diet that includes fresh fruits and vegetables, whole grains, soybeans, eggs, lean meat, and low-fat dairy.  Get at least 30 minutes of physical activity on 5 or more days each week.  Avoid alcoholic and caffeinated beverages, as well as spicy foods. This may help prevent hot flashes.  Get 7-8 hours of sleep each night.  Dress in layers that can be removed to help you manage hot flashes.  Find ways to manage stress, such as deep breathing, meditation, or journaling. General instructions  Keep track of your menstrual periods, including: ? When they occur. ? How heavy they are and how long they last. ? How much time passes between periods.  Keep track of your symptoms, noting when they start, how often you have them, and how long they last.  Take over-the-counter and prescription medicines only as told by your health care provider.  Take vitamin supplements only as told by your  health care provider. These may include calcium, vitamin E, and vitamin D.  Use vaginal lubricants or moisturizers to help with vaginal dryness and improve comfort during sex.  Talk with your health care provider before starting any herbal  supplements.  Keep all follow-up visits as told by your health care provider. This is important. This includes any group therapy or counseling. Contact a health care provider if:  You have heavy vaginal bleeding or pass blood clots.  Your period lasts more than 2 days longer than normal.  Your periods are recurring sooner than 21 days.  You bleed after having sex. Get help right away if:  You have chest pain, trouble breathing, or trouble talking.  You have severe depression.  You have pain when you urinate.  You have severe headaches.  You have vision problems. Summary  Perimenopause is the time when a woman's body begins to move into menopause. This may happen naturally or as a result of other health problems or medical treatments.  Perimenopause can begin 2-8 years before menopause, and it usually lasts for 1 year after menopause.  Perimenopausal symptoms can be managed through medicines, lifestyle changes, and complementary therapies such as acupuncture. This information is not intended to replace advice given to you by your health care provider. Make sure you discuss any questions you have with your health care provider. Document Revised: 11/26/2017 Document Reviewed: 01/19/2017 Elsevier Patient Education  2020 [employer]  Major Depressive Disorder, Adult Major depressive disorder (MDD) is a mental health condition. It may also be called clinical depression or unipolar depression. MDD usually causes feelings of sadness, hopelessness, or helplessness. MDD can also cause physical symptoms. It can interfere with work, school, relationships, and other everyday activities. MDD may be mild, moderate, or severe. It may occur once (single episode major depressive disorder) or it may occur multiple times (recurrent major depressive disorder). What are the causes? The exact cause of this condition is not known. MDD is most likely caused by a combination of things, which may  include:  Genetic factors. These are traits that are passed along from parent to child.  Individual factors. Your personality, your behavior, and the way you handle your thoughts and feelings may contribute to MDD. This includes personality traits and behaviors learned from others.  Physical factors, such as: ? Differences in the part of your brain that controls emotion. This part of your brain may be different than it is in people who do not have MDD. ? Long-term (chronic) medical or psychiatric illnesses.  Social factors. Traumatic experiences or major life changes may play a role in the development of MDD. What increases the risk? This condition is more likely to develop in women. The following factors may also make you more likely to develop MDD:  A family history of depression.  Troubled family relationships.  Abnormally low levels of certain brain chemicals.  Traumatic events in childhood, especially abuse or the loss of a parent.  Being under a lot of stress, or long-term stress, especially from upsetting life experiences or losses.  A history of: ? Chronic physical illness. ? Other mental health disorders. ? Substance abuse.  Poor living conditions.  Experiencing social exclusion or discrimination on a regular basis. What are the signs or symptoms? The main symptoms of MDD typically include:  Constant depressed or irritable mood.  Loss of interest in things and activities. MDD symptoms may also include:  Sleeping or eating too much or too little.  Unexplained weight change.  Fatigue or low energy.  Feelings of worthlessness or guilt.  Difficulty thinking clearly or making decisions.  Thoughts of suicide or of harming others.  Physical agitation or weakness.  Isolation. Severe cases of MDD may also occur with other symptoms, such as:  Delusions or hallucinations, in which you imagine things that are not real (psychotic depression).  Low-level  depression that lasts at least a year (chronic depression or persistent depressive disorder).  Extreme sadness and hopelessness (melancholic depression).  Trouble speaking and moving (catatonic depression). How is this diagnosed? This condition may be diagnosed based on:  Your symptoms.  Your medical history, including your mental health history. This may involve tests to evaluate your mental health. You may be asked questions about your lifestyle, including any drug and alcohol use, and how long you have had symptoms of MDD.  A physical exam.  Blood tests to rule out other conditions. You must have a depressed mood and at least four other MDD symptoms most of the day, nearly every day in the same 2-week timeframe before your health care provider can confirm a diagnosis of MDD. How is this treated? This condition is usually treated by mental health professionals, such as psychologists, psychiatrists, and clinical social workers. You may need more than one type of treatment. Treatment may include:  Psychotherapy. This is also called talk therapy or counseling. Types of psychotherapy include: ? Cognitive behavioral therapy (CBT). This type of therapy teaches you to recognize unhealthy feelings, thoughts, and behaviors, and replace them with positive thoughts and actions. ? Interpersonal therapy (IPT). This helps you to improve the way you relate to and communicate with others. ? Family therapy. This treatment includes members of your family.  Medicine to treat anxiety and depression, or to help you control certain emotions and behaviors.  Lifestyle changes, such as: ? Limiting alcohol and drug use. ? Exercising regularly. ? Getting plenty of sleep. ? Making healthy eating choices. ? Spending more time outdoors.  Treatments involving stimulation of the brain can be used in situations with extremely severe symptoms, or when medicine or other therapies do not work over time. These  treatments include electroconvulsive therapy, transcranial magnetic stimulation, and vagal nerve stimulation. Follow these instructions at home: Activity  Return to your normal activities as told by your health care provider.  Exercise regularly and spend time outdoors as told by your health care provider. General instructions  Take over-the-counter and prescription medicines only as told by your health care provider.  Do not drink alcohol. If you drink alcohol, limit your alcohol intake to no more than 1 drink a day for nonpregnant women and 2 drinks a day for men. One drink equals 12 oz of beer, 5 oz of wine, or 1 oz of hard liquor. Alcohol can affect any antidepressant medicines you are taking. Talk to your health care provider about your alcohol use.  Eat a healthy diet and get plenty of sleep.  Find activities that you enjoy doing, and make time to do them.  Consider joining a support group. Your health care provider may be able to recommend a support group.  Keep all follow-up visits as told by your health care provider. This is important. Where to find more information [employer] on Mental Illness  www.nami.org [place] of Mental Health  http://www.maynard.net/ National Suicide Prevention Lifeline  1-800-273-TALK 812-235-7349). This is free, 24-hour help. Contact a health care provider if:  Your symptoms get worse.  You develop new symptoms. Get help  right away if:  You self-harm.  You have serious thoughts about hurting yourself or others.  You see, hear, taste, smell, or feel things that are not present (hallucinate). This information is not intended to replace advice given to you by your health care provider. Make sure you discuss any questions you have with your health care provider. Document Revised: 11/26/2017 Document Reviewed: 06/24/2016 Elsevier Patient Education  2020 [employer].

## 2020-05-15 LAB — T4, FREE: Free T4: 0.92 ng/dL (ref 0.60–1.60)

## 2020-05-15 LAB — TSH: TSH: 4.18 u[IU]/mL (ref 0.35–4.50)

## 2020-05-15 NOTE — Addendum Note (Signed)
Addended by: Asencion Partridge on: 05/15/2020 08:14 AM   Modules accepted: Orders

## 2020-05-31 ENCOUNTER — Encounter: Payer: Self-pay | Admitting: Physician Assistant

## 2020-05-31 ENCOUNTER — Telehealth: Payer: No Typology Code available for payment source | Admitting: Physician Assistant

## 2020-05-31 DIAGNOSIS — L237 Allergic contact dermatitis due to plants, except food: Secondary | ICD-10-CM | POA: Diagnosis not present

## 2020-05-31 MED ORDER — PREDNISONE 10 MG PO TABS
ORAL_TABLET | ORAL | 0 refills | Status: DC
Start: 2020-05-31 — End: 2020-07-09

## 2020-05-31 NOTE — Progress Notes (Signed)
E Visit for Rash  We are sorry that you are not feeling well. Here is how we plan to help!  Based on what you shared with me it looks like you have contact dermatitis.  Contact dermatitis is a skin rash caused by something that touches the skin and causes irritation or inflammation.  Your skin may be red, swollen, dry, cracked, and itch.  The rash should go away in a few days but can last a few weeks.  If you get a rash, it's important to figure out what caused it so the irritant can be avoided in the future.  I have prescribed Prednisone 10  Prednisone 10 mg daily for 6 days (see taper instructions below)  Directions for 6 day taper: Day 1: 2 tablets before breakfast, 1 after both lunch & dinner and 2 at bedtime Day 2: 1 tab before breakfast, 1 after both lunch & dinner and 2 at bedtime Day 3: 1 tab at each meal & 1 at bedtime Day 4: 1 tab at breakfast, 1 at lunch, 1 at bedtime Day 5: 1 tab at breakfast & 1 tab at bedtime Day 6: 1 tab at breakfast      HOME CARE:   Take cool showers and avoid direct sunlight.  Apply cool compress or wet dressings.  Take a bath in an oatmeal bath.  Sprinkle content of one Aveeno packet under running faucet with comfortably warm water.  Bathe for 15-20 minutes, 1-2 times daily.  Pat dry with a towel. Do not rub the rash.  Use hydrocortisone cream.  Take an antihistamine like Benadryl for widespread rashes that itch.  The adult dose of Benadryl is 25-50 mg by mouth 4 times daily.  Caution:  This type of medication may cause sleepiness.  Do not drink alcohol, drive, or operate dangerous machinery while taking antihistamines.  Do not take these medications if you have prostate enlargement.  Read package instructions thoroughly on all medications that you take.  GET HELP RIGHT AWAY IF:   Symptoms don't go away after treatment.  Severe itching that persists.  If you rash spreads or swells.  If you rash begins to smell.  If it blisters and  opens or develops a yellow-brown crust.  You develop a fever.  You have a sore throat.  You become short of breath.  MAKE SURE YOU:  Understand these instructions. Will watch your condition. Will get help right away if you are not doing well or get worse.  Thank you for choosing an e-visit. Your e-visit answers were reviewed by a board certified advanced clinical practitioner to complete your personal care plan. Depending upon the condition, your plan could have included both over the counter or prescription medications. Please review your pharmacy choice. Be sure that the pharmacy you have chosen is open so that you can pick up your prescription now.  If there is a problem you may message your provider in MyChart to have the prescription routed to another pharmacy. Your safety is important to [place]. If you have drug allergies check your prescription carefully.  For the next 24 hours, you can use MyChart to ask questions about todays visit, request a non-urgent call back, or ask for a work or school excuse from your e-visit provider. You will get an email in the next two days asking about your experience. I hope that your e-visit has been valuable and will speed your recovery.     I spent 5-10 minutes on review and completion  of this note- Lacy Duverney [facility]

## 2020-07-05 MED FILL — ESCITALOPRAM 10 MG TABLET: 10 | 30 days supply | Qty: 30 | Fill #2

## 2020-07-05 MED FILL — ADDERALL XR 20 MG CAP SA: 20 | 30 days supply | Qty: 30 | Fill #0

## 2020-07-09 ENCOUNTER — Telehealth (INDEPENDENT_AMBULATORY_CARE_PROVIDER_SITE_OTHER): Payer: No Typology Code available for payment source | Admitting: Family Medicine

## 2020-07-09 ENCOUNTER — Encounter: Payer: Self-pay | Admitting: Family Medicine

## 2020-07-09 DIAGNOSIS — F900 Attention-deficit hyperactivity disorder, predominantly inattentive type: Secondary | ICD-10-CM | POA: Diagnosis not present

## 2020-07-09 DIAGNOSIS — F320 Major depressive disorder, single episode, mild: Secondary | ICD-10-CM | POA: Diagnosis not present

## 2020-07-09 DIAGNOSIS — E7800 Pure hypercholesterolemia, unspecified: Secondary | ICD-10-CM | POA: Diagnosis not present

## 2020-07-09 DIAGNOSIS — I10 Essential (primary) hypertension: Secondary | ICD-10-CM

## 2020-07-09 NOTE — Progress Notes (Signed)
Virtual Visit via Video Note  SUBJECTIVE CC:  Chief Complaint  Patient presents with  . Mood f/u.    Feels that her mood has gotten a little better since her last office visit.     I connected with Alison [name] on 07/09/20 at 11:00 AM EDT by a video enabled telemedicine application and verified that I am speaking with the correct person using two identifiers. Location patient: Home Location provider: [facility] Persons participating in the virtual visit: Alison [name], Willow Ora, MD Adela Glimpse, CMA   I discussed the limitations of evaluation and management by telemedicine and the availability of in person appointments. The patient expressed understanding and agreed to proceed.  HPI: Alison [name] is a 49 y.o. female who was contacted today to address the problems listed above in the chief complaint/mood.  6-week follow-up for depressive episode, start Lexapro.  Patient feels her symptoms are mildly improved.  Still remains unmotivated but energy is slightly improved.  She is less irritable and less emotional.  No adverse effects from medications. Current symptoms include those listed below.  She denies current suicidal or homicidal plan or intent.  Depression screen [facility] 2/9 07/09/2020 09/19/2019 06/20/2019  Decreased Interest 0 0 0  Down, Depressed, Hopeless 1 0 0  PHQ - 2 Score 1 0 0  Altered sleeping 1 - -  Tired, decreased energy 1 - -  Change in appetite 0 - -  Feeling bad or failure about yourself  1 - -  Trouble concentrating 1 - -  Moving slowly or fidgety/restless 0 - -  Suicidal thoughts 0 - -  PHQ-9 Score 5 - -  Difficult doing work/chores Somewhat difficult - -    Reviewed her lab work from her recent physical.  Mild hypercholesterolemia: Healthy diet recommended.  Normal TFTs  Blood pressure remains well controlled  ADD: She continues to take her medications and feels that her focus and attention is fairly well  managed although she believes that her mood is negatively impacting this as well.  Review of systems is significant for perimenopausal hot flushing and increased sweating without palpitations, chest pain or shortness of breath   ASSESSMENT 1. Depression, major, single episode, mild (HCC)   2. Attention deficit hyperactivity disorder (ADHD), predominantly inattentive type   3. Essential hypertension   4. Hypercholesterolemia      Depression: Slightly improved.  Slight positive response to Lexapro.  No adverse effects.  Continue same dose and recheck in 3 months.  Consider increasing dose if not yet controlled or changing to different SSRI.  Patient understands and agrees with care plan  ADD: Fairly well-controlled, no change in medications at this time.  Monitor, recheck 3 months  Hypertension: Well-controlled  Hypercholesterolemia: Mild with low overall cardiovascular risk for.  Recommend healthy diet and weight  I discussed the assessment and treatment plan with the patient. The patient was provided an opportunity to ask questions and all were answered. The patient agreed with the plan and demonstrated an understanding of the instructions.   The patient was advised to call back or seek an in-person evaluation if the symptoms worsen or if the condition fails to improve as anticipated. Follow up: Return in about 3 months (around 10/09/2020) for mood follow up.  Visit date not found  No orders of the defined types were placed in this encounter.     I reviewed the patients updated PMH, FH, and SocHx.  Patient Active Problem List   Diagnosis Date Noted  . Attention deficit hyperactivity disorder 10/02/2019    Priority: High  . Essential hypertension 05/09/2018    Priority: High  . Osteopenia 10/02/2019    Priority: Medium  . IUD (intrauterine device) in place 05/09/2018    Priority: Medium  . DJD (degenerative joint disease), lumbar 05/09/2018    Priority: Medium  . Genital  warts 10/02/2019    Priority: Low  . Goiter 06/20/2019    Priority: Low  . Depression, major, single episode, mild (HCC) 05/14/2020   Current Meds  Medication Sig  . [START ON 09/30/2020] amphetamine-dextroamphetamine (ADDERALL XR) 20 MG 24 hr capsule Take 1 capsule (20 mg total) by mouth daily.  Marland Kitchen escitalopram (LEXAPRO) 10 MG tablet Take 1 tablet (10 mg total) by mouth daily.  . hydrochlorothiazide (MICROZIDE) 12.5 MG capsule TAKE 1 CAPSULE BY MOUTH DAILY.  Marland Kitchen levonorgestrel (MIRENA, 52 MG,) 20 MCG/24HR IUD 20 Intra Uterine Devices by Intrauterine route daily.  . metoprolol succinate (TOPROL-XL) 50 MG 24 hr tablet TAKE 1 TABLET (50 MG TOTAL) BY MOUTH DAILY.    Allergies: Patient has No Known Allergies. Family History: Patient family history includes Anxiety disorder in her sister; Atrial fibrillation in her father; COPD in her father; Fibroids in her sister; Goiter in her mother; Healthy in her daughter; Heart disease in her father, maternal uncle, and mother; Hypertension in her father, mother, and sister; Lymphoma in her father; Stroke in her mother. Social History:  Patient  reports that she has quit smoking. Her smoking use included cigarettes. She has never used smokeless tobacco. She reports that she does not drink alcohol and does not use drugs.  Review of Systems: Constitutional: Negative for fever malaise or anorexia Cardiovascular: negative for chest pain Respiratory: negative for SOB or persistent cough Gastrointestinal: negative for abdominal pain  OBJECTIVE/OBSERVATIONS: General: no acute distress, well appearing, no apparent distress, well groomed Psych:  Alert and oriented x 3,normal mood, behavior, speech, dress, and thought processes.   Willow Ora, MD

## 2020-07-11 NOTE — Progress Notes (Signed)
Patient is scheduled for Monday at 315

## 2020-07-15 ENCOUNTER — Encounter: Payer: Self-pay | Admitting: Family Medicine

## 2020-07-15 ENCOUNTER — Other Ambulatory Visit: Payer: No Typology Code available for payment source

## 2020-07-15 NOTE — Telephone Encounter (Signed)
Ok to switch pt's lab appt from today to Friday afternoon? Please advise

## 2020-07-15 NOTE — Telephone Encounter (Signed)
F/u elevated liver tests labs: yes, ok to change. thanks

## 2020-07-15 NOTE — Telephone Encounter (Signed)
LVM for patient letting her know that I moved her appt to Friday.

## 2020-07-15 NOTE — Telephone Encounter (Signed)
Please reschedule pt's lab appt

## 2020-07-19 ENCOUNTER — Other Ambulatory Visit: Payer: No Typology Code available for payment source

## 2020-07-19 ENCOUNTER — Other Ambulatory Visit: Payer: Self-pay

## 2020-07-19 DIAGNOSIS — R7989 Other specified abnormal findings of blood chemistry: Secondary | ICD-10-CM

## 2020-07-19 NOTE — Addendum Note (Signed)
Addended by: Altamese Cabal on: 07/19/2020 03:54 PM   Modules accepted: Orders

## 2020-07-22 LAB — HEPATIC FUNCTION PANEL
AG Ratio: 1.6 (calc) (ref 1.0–2.5)
ALT: 21 U/L (ref 6–29)
AST: 22 U/L (ref 10–35)
Albumin: 4.2 g/dL (ref 3.6–5.1)
Alkaline phosphatase (APISO): 62 U/L (ref 37–153)
Bilirubin, Direct: 0.2 mg/dL (ref 0.0–0.2)
Globulin: 2.7 g/dL (calc) (ref 1.9–3.7)
Indirect Bilirubin: 0.5 mg/dL (calc) (ref 0.2–1.2)
Total Bilirubin: 0.7 mg/dL (ref 0.2–1.2)
Total Protein: 6.9 g/dL (ref 6.1–8.1)

## 2020-07-22 LAB — HEPATITIS C ANTIBODY
Hepatitis C Ab: NONREACTIVE
SIGNAL TO CUT-OFF: 0.01 (ref <1.00)

## 2020-07-22 LAB — HEPATITIS B SURFACE ANTIGEN: Hepatitis B Surface Ag: NONREACTIVE

## 2020-08-05 ENCOUNTER — Other Ambulatory Visit: Payer: Self-pay | Admitting: Family Medicine

## 2020-08-05 MED FILL — ESCITALOPRAM 10 MG TABLET: 10 | 30 days supply | Qty: 30 | Fill #0

## 2020-08-05 MED FILL — HYDROCHLOROTHIAZIDE 12.5 MG: 12.5 | 90 days supply | Qty: 90 | Fill #3

## 2020-08-05 MED FILL — ADDERALL XR 20 MG CAP SA: 20 | 30 days supply | Qty: 30 | Fill #0

## 2020-08-05 MED FILL — METOPROLOL SUCCINATE ER 50: 50 | 90 days supply | Qty: 90 | Fill #3

## 2020-09-06 ENCOUNTER — Other Ambulatory Visit: Payer: Self-pay | Admitting: Family Medicine

## 2020-09-06 MED FILL — ADDERALL XR 20 MG CAP SA: 20 | 30 days supply | Qty: 30 | Fill #0

## 2020-09-06 MED FILL — ESCITALOPRAM 10 MG TABLET: 10 | 30 days supply | Qty: 30 | Fill #1

## 2020-09-06 NOTE — Telephone Encounter (Signed)
Last refill: Last OV: 05/14/20 dx. CPE  Patient's prescription expired for the month of Sept

## 2020-10-07 ENCOUNTER — Other Ambulatory Visit: Payer: Self-pay | Admitting: Family Medicine

## 2020-10-07 MED FILL — ADDERALL XR 20 MG CAP SA: 20 | 30 days supply | Qty: 30 | Fill #0

## 2020-10-07 MED FILL — ESCITALOPRAM 10 MG TABLET: 10 | 30 days supply | Qty: 30 | Fill #2

## 2020-10-07 NOTE — Telephone Encounter (Signed)
Patient is scheduled for 11/5 for a follow up

## 2020-10-07 NOTE — Telephone Encounter (Signed)
Refilled adderall. Is due for a follow up for ADD; please schedule within the next 2-3 months. Thanks.

## 2020-11-01 ENCOUNTER — Other Ambulatory Visit: Payer: Self-pay

## 2020-11-01 ENCOUNTER — Encounter: Payer: Self-pay | Admitting: Family Medicine

## 2020-11-01 ENCOUNTER — Other Ambulatory Visit: Payer: Self-pay | Admitting: Family Medicine

## 2020-11-01 ENCOUNTER — Telehealth (INDEPENDENT_AMBULATORY_CARE_PROVIDER_SITE_OTHER): Payer: No Typology Code available for payment source | Admitting: Family Medicine

## 2020-11-01 DIAGNOSIS — F320 Major depressive disorder, single episode, mild: Secondary | ICD-10-CM

## 2020-11-01 DIAGNOSIS — I10 Essential (primary) hypertension: Secondary | ICD-10-CM

## 2020-11-01 DIAGNOSIS — F900 Attention-deficit hyperactivity disorder, predominantly inattentive type: Secondary | ICD-10-CM

## 2020-11-01 DIAGNOSIS — F419 Anxiety disorder, unspecified: Secondary | ICD-10-CM | POA: Diagnosis not present

## 2020-11-01 MED ORDER — AMPHETAMINE-DEXTROAMPHET ER 20 MG PO CP24: 20.0000 mg | ORAL_CAPSULE | Freq: Every day | ORAL | 0 refills | Status: DC

## 2020-11-01 MED ORDER — ESCITALOPRAM OXALATE 10 MG PO TABS: 10.0000 mg | ORAL_TABLET | Freq: Every day | ORAL | 3 refills | Status: DC

## 2020-11-01 MED ORDER — METOPROLOL SUCCINATE ER 50 MG PO TB24: 50.0000 mg | ORAL_TABLET | Freq: Every day | ORAL | 3 refills | Status: DC

## 2020-11-01 MED ORDER — HYDROCHLOROTHIAZIDE 12.5 MG PO CAPS: 12.5000 mg | ORAL_CAPSULE | Freq: Every day | ORAL | 3 refills | Status: DC

## 2020-11-01 MED FILL — METOPROLOL SUCCINATE ER 50: 50 | 90 days supply | Qty: 90 | Fill #0

## 2020-11-01 MED FILL — ESCITALOPRAM 10 MG TABLET: 10 | 90 days supply | Qty: 90 | Fill #0

## 2020-11-01 MED FILL — HYDROCHLOROTHIAZIDE 12.5 MG: 12.5 | 90 days supply | Qty: 90 | Fill #0

## 2020-11-01 NOTE — Patient Instructions (Signed)
Please return in May 2022 for your annual complete physical; please come fasting. And ADD, HTN follow up.  Please call [facility] to schedule an appointment with Dr. Colen Darling; she is a therapist here at our [place] office.  The phone number is: [phone]   If you have any questions or concerns, please don't hesitate to send me a message via MyChart or call the office at [phone]. Thank you for visiting with [place] today! It's our pleasure caring for you.

## 2020-11-01 NOTE — Progress Notes (Signed)
Virtual Visit via Video Note  SUBJECTIVE CC:  Chief Complaint  Patient presents with  . Depression  . Anxiety    states that she is always worried about getting sick - like cancer or an uncurable disease, something that crosses her mind daily    I connected with Alison [name] on 11/01/20 at  9:00 AM EDT by a video enabled telemedicine application and verified that I am speaking with the correct person using two identifiers. Location patient: Home Location provider: [facility] [place] Persons participating in the virtual visit: Alison [name], Alison Ora, MD Alison [name], CMA  I discussed the limitations of evaluation and management by telemedicine and the availability of in person appointments. The patient expressed understanding and agreed to proceed.  HPI: [name] is a 49 y.o. female who was contacted today to address the problems listed above in the chief complaint/mood.   Depression and anxiety f/u: reviewed last note. On lexapro x 57months or so: She reports her depressive symptoms are much improved.  No longer feeling down.  Motivation is better.  She is more active.  She is getting up sometimes.  No adverse effects from medications.  Admits she is a chronic worrier and has daily negative concerns and worries about her overall wellbeing.  She worries she will get sick.  May be related to early deaths of her parents, her mother age 49 from a stroke and her father age 31 due to end-stage COPD.  She tries to keep healthy.  She has these worries daily, they are fleeting.  Minimally affect her but she has been dealing with them for several years.  She understands they are slightly irrational.   Hypertension: Checks regularly at home.  Normally well controlled.  Today's blood pressure readings mildly elevated.  She has been very active.  She continues her medications.  She does need refills.  No chest pain shortness of breath  ADD follow-up: She  is taking her medications without adverse effects.  Definitely helps her attention and focus.  Keeps her on task.  No adverse effects.  Blood pressure has been controlled.   Depression screen [facility] 2/9 07/09/2020 09/19/2019 06/20/2019  Decreased Interest 0 0 0  Down, Depressed, Hopeless 1 0 0  PHQ - 2 Score 1 0 0  Altered sleeping 1 - -  Tired, decreased energy 1 - -  Change in appetite 0 - -  Feeling bad or failure about yourself  1 - -  Trouble concentrating 1 - -  Moving slowly or fidgety/restless 0 - -  Suicidal thoughts 0 - -  PHQ-9 Score 5 - -  Difficult doing work/chores Somewhat difficult - -    ASSESSMENT 1. Depression, major, single episode, mild (HCC)   2. Attention deficit hyperactivity disorder (ADHD), predominantly inattentive type   3. Essential hypertension   4. Anxiety      Depression: Depression is now well controlled.  Continue Lexapro 10 mg daily.  Education given  Anxiety and mood: Sounds related to unresolved conflict surrounding the death of her parents.  Recommend psychotherapy.  She also brings up concerns over problems with history of difficulties with her religion.  Counseling could definitely be helpful  Essential hypertension: Well-controlled by report.  Refill medications  ADD: Well-controlled refill medications for 3 months  I discussed the assessment and treatment plan with the patient. The patient was provided an opportunity to ask questions and all were answered. The  patient agreed with the plan and demonstrated an understanding of the instructions.   The patient was advised to call back or seek an in-person evaluation if the symptoms worsen or if the condition fails to improve as anticipated. Follow up: CPE due in May. Visit date not found  Meds ordered this encounter  Medications  . escitalopram (LEXAPRO) 10 MG tablet    Sig: Take 1 tablet (10 mg total) by mouth daily.    Dispense:  90 tablet    Refill:  3  . metoprolol succinate  (TOPROL-XL) 50 MG 24 hr tablet    Sig: Take 1 tablet (50 mg total) by mouth daily.    Dispense:  90 tablet    Refill:  3  . hydrochlorothiazide (MICROZIDE) 12.5 MG capsule    Sig: Take 1 capsule (12.5 mg total) by mouth daily.    Dispense:  90 capsule    Refill:  3  . amphetamine-dextroamphetamine (ADDERALL XR) 20 MG 24 hr capsule    Sig: Take 1 capsule (20 mg total) by mouth daily.    Dispense:  30 capsule    Refill:  0  . amphetamine-dextroamphetamine (ADDERALL XR) 20 MG 24 hr capsule    Sig: Take 1 capsule (20 mg total) by mouth daily.    Dispense:  30 capsule    Refill:  0    Please keep on file for next refill due december  . amphetamine-dextroamphetamine (ADDERALL XR) 20 MG 24 hr capsule    Sig: Take 1 capsule (20 mg total) by mouth daily.    Dispense:  30 capsule    Refill:  0    Please keep on file for next refill due January      I reviewed the patients updated PMH, FH, and SocHx.    Patient Active Problem List   Diagnosis Date Noted  . Attention deficit hyperactivity disorder 10/02/2019    Priority: High  . Essential hypertension 05/09/2018    Priority: High  . Osteopenia 10/02/2019    Priority: Medium  . IUD (intrauterine device) in place 05/09/2018    Priority: Medium  . DJD (degenerative joint disease), lumbar 05/09/2018    Priority: Medium  . Genital warts 10/02/2019    Priority: Low  . Goiter 06/20/2019    Priority: Low  . Depression, major, single episode, mild (HCC) 05/14/2020   Current Meds  Medication Sig  . amphetamine-dextroamphetamine (ADDERALL XR) 20 MG 24 hr capsule Take 1 capsule (20 mg total) by mouth daily.  Melene Muller ON 12/01/2020] amphetamine-dextroamphetamine (ADDERALL XR) 20 MG 24 hr capsule Take 1 capsule (20 mg total) by mouth daily.  Melene Muller ON 12/31/2020] amphetamine-dextroamphetamine (ADDERALL XR) 20 MG 24 hr capsule Take 1 capsule (20 mg total) by mouth daily.  Marland Kitchen escitalopram (LEXAPRO) 10 MG tablet Take 1 tablet (10 mg total) by  mouth daily.  . hydrochlorothiazide (MICROZIDE) 12.5 MG capsule Take 1 capsule (12.5 mg total) by mouth daily.  Marland Kitchen levonorgestrel (MIRENA, 52 MG,) 20 MCG/24HR IUD 20 Intra Uterine Devices by Intrauterine route daily.  . metoprolol succinate (TOPROL-XL) 50 MG 24 hr tablet Take 1 tablet (50 mg total) by mouth daily.  . [DISCONTINUED] ADDERALL XR 20 MG 24 hr capsule TAKE 1 CAPSULE BY MOUTH ONCE DAILY  . [DISCONTINUED] escitalopram (LEXAPRO) 10 MG tablet TAKE 1 TABLET BY MOUTH DAILY  . [DISCONTINUED] hydrochlorothiazide (MICROZIDE) 12.5 MG capsule TAKE 1 CAPSULE BY MOUTH DAILY.  . [DISCONTINUED] metoprolol succinate (TOPROL-XL) 50 MG 24 hr tablet TAKE 1 TABLET (  50 MG TOTAL) BY MOUTH DAILY.    Allergies: Patient has No Known Allergies. Family History: Patient family history includes Anxiety disorder in her sister; Atrial fibrillation in her father; COPD in her father; Fibroids in her sister; Goiter in her mother; Healthy in her daughter; Heart disease in her father, maternal uncle, and mother; Hypertension in her father, mother, and sister; Lymphoma in her father; Stroke in her mother; Sudden Cardiac Death in her mother. Social History:  Patient  reports that she has quit smoking. Her smoking use included cigarettes. She has never used smokeless tobacco. She reports that she does not drink alcohol and does not use drugs.  Review of Systems: Constitutional: Negative for fever malaise or anorexia Cardiovascular: negative for chest pain Respiratory: negative for SOB or persistent cough Gastrointestinal: negative for abdominal pain  OBJECTIVE/OBSERVATIONS: General: no acute distress, well appearing, no apparent distress, well groomed Psych:  Alert and oriented x 3,normal mood, behavior, speech, dress, and thought processes.   Alison Ora, MD

## 2020-11-05 MED FILL — ADDERALL XR 20 MG CAP SA: 20 | 30 days supply | Qty: 30 | Fill #0

## 2020-12-18 MED FILL — ADDERALL XR 20 MG CAP SA: 20 | 30 days supply | Qty: 30 | Fill #0

## 2021-01-27 DIAGNOSIS — Z01419 Encounter for gynecological examination (general) (routine) without abnormal findings: Secondary | ICD-10-CM | POA: Diagnosis not present

## 2021-01-27 DIAGNOSIS — Z6827 Body mass index (BMI) 27.0-27.9, adult: Secondary | ICD-10-CM | POA: Diagnosis not present

## 2021-01-27 DIAGNOSIS — Z1231 Encounter for screening mammogram for malignant neoplasm of breast: Secondary | ICD-10-CM | POA: Diagnosis not present

## 2021-01-27 DIAGNOSIS — Z13820 Encounter for screening for osteoporosis: Secondary | ICD-10-CM | POA: Diagnosis not present

## 2021-01-27 DIAGNOSIS — N951 Menopausal and female climacteric states: Secondary | ICD-10-CM | POA: Diagnosis not present

## 2021-01-27 MED FILL — ADDERALL XR 20 MG CAP SA: 20 | 30 days supply | Qty: 30 | Fill #0

## 2021-01-27 MED FILL — METOPROLOL SUCCINATE ER 50: 50 | 90 days supply | Qty: 90 | Fill #1

## 2021-01-27 MED FILL — HYDROCHLOROTHIAZIDE 12.5 MG: 12.5 | 90 days supply | Qty: 90 | Fill #1

## 2021-01-27 MED FILL — ESCITALOPRAM 10 MG TABLET: 10 | 90 days supply | Qty: 90 | Fill #1

## 2021-03-07 ENCOUNTER — Other Ambulatory Visit: Payer: Self-pay | Admitting: Family Medicine

## 2021-03-07 MED FILL — ADDERALL XR 20 MG CAP SA: 20 | 30 days supply | Qty: 30 | Fill #0

## 2021-03-07 NOTE — Telephone Encounter (Signed)
Patient is scheduled for May for a CPE.

## 2021-03-07 NOTE — Telephone Encounter (Signed)
Please call patient to schedule cpe due in may.  Refilled adderall for this month.

## 2021-04-07 ENCOUNTER — Other Ambulatory Visit: Payer: Self-pay | Admitting: Family Medicine

## 2021-04-07 ENCOUNTER — Other Ambulatory Visit (HOSPITAL_COMMUNITY): Payer: Self-pay

## 2021-04-07 MED ORDER — AMPHETAMINE-DEXTROAMPHET ER 20 MG PO CP24
ORAL_CAPSULE | Freq: Every day | ORAL | 0 refills | Status: DC
  Filled 2021-04-07: qty 30, 30d supply, fill #0

## 2021-04-08 ENCOUNTER — Other Ambulatory Visit (HOSPITAL_COMMUNITY): Payer: Self-pay

## 2021-05-04 MED FILL — Escitalopram Oxalate Tab 10 MG (Base Equiv): ORAL | 90 days supply | Qty: 90 | Fill #0 | Status: AC

## 2021-05-04 MED FILL — Hydrochlorothiazide Cap 12.5 MG: ORAL | 90 days supply | Qty: 90 | Fill #0 | Status: AC

## 2021-05-04 MED FILL — Metoprolol Succinate Tab ER 24HR 50 MG (Tartrate Equiv): ORAL | 90 days supply | Qty: 90 | Fill #0 | Status: AC

## 2021-05-05 ENCOUNTER — Other Ambulatory Visit (HOSPITAL_COMMUNITY): Payer: Self-pay

## 2021-05-14 ENCOUNTER — Other Ambulatory Visit (HOSPITAL_COMMUNITY): Payer: Self-pay

## 2021-05-14 ENCOUNTER — Encounter: Payer: Self-pay | Admitting: Family Medicine

## 2021-05-14 ENCOUNTER — Ambulatory Visit (INDEPENDENT_AMBULATORY_CARE_PROVIDER_SITE_OTHER): Payer: 59 | Admitting: Family Medicine

## 2021-05-14 ENCOUNTER — Other Ambulatory Visit: Payer: Self-pay

## 2021-05-14 VITALS — BP 124/80 | HR 68 | Temp 97.9°F | Ht 66.0 in | Wt 167.4 lb

## 2021-05-14 DIAGNOSIS — Z0000 Encounter for general adult medical examination without abnormal findings: Secondary | ICD-10-CM

## 2021-05-14 DIAGNOSIS — Z975 Presence of (intrauterine) contraceptive device: Secondary | ICD-10-CM | POA: Diagnosis not present

## 2021-05-14 DIAGNOSIS — N393 Stress incontinence (female) (male): Secondary | ICD-10-CM | POA: Diagnosis not present

## 2021-05-14 DIAGNOSIS — F900 Attention-deficit hyperactivity disorder, predominantly inattentive type: Secondary | ICD-10-CM | POA: Diagnosis not present

## 2021-05-14 DIAGNOSIS — J301 Allergic rhinitis due to pollen: Secondary | ICD-10-CM | POA: Diagnosis not present

## 2021-05-14 DIAGNOSIS — I10 Essential (primary) hypertension: Secondary | ICD-10-CM

## 2021-05-14 DIAGNOSIS — E049 Nontoxic goiter, unspecified: Secondary | ICD-10-CM

## 2021-05-14 DIAGNOSIS — F320 Major depressive disorder, single episode, mild: Secondary | ICD-10-CM | POA: Diagnosis not present

## 2021-05-14 LAB — COMPREHENSIVE METABOLIC PANEL
ALT: 24 U/L (ref 0–35)
AST: 38 U/L — ABNORMAL HIGH (ref 0–37)
Albumin: 4.2 g/dL (ref 3.5–5.2)
Alkaline Phosphatase: 79 U/L (ref 39–117)
BUN: 8 mg/dL (ref 6–23)
CO2: 29 mEq/L (ref 19–32)
Calcium: 9 mg/dL (ref 8.4–10.5)
Chloride: 100 mEq/L (ref 96–112)
Creatinine, Ser: 0.76 mg/dL (ref 0.40–1.20)
GFR: 90.43 mL/min (ref >60.00)
Glucose, Bld: 90 mg/dL (ref 70–99)
Potassium: 4.1 mEq/L (ref 3.5–5.1)
Sodium: 137 mEq/L (ref 135–145)
Total Bilirubin: 0.5 mg/dL (ref 0.2–1.2)
Total Protein: 7.1 g/dL (ref 6.0–8.3)

## 2021-05-14 LAB — LIPID PANEL
Cholesterol: 223 mg/dL — ABNORMAL HIGH (ref 0–200)
HDL: 63.2 mg/dL (ref >39.00)
LDL Cholesterol: 137 mg/dL — ABNORMAL HIGH (ref 0–99)
NonHDL: 159.52
Total CHOL/HDL Ratio: 4
Triglycerides: 115 mg/dL (ref 0.0–149.0)
VLDL: 23 mg/dL (ref 0.0–40.0)

## 2021-05-14 LAB — TSH: TSH: 3.16 u[IU]/mL (ref 0.35–4.50)

## 2021-05-14 LAB — CBC WITH DIFFERENTIAL/PLATELET
Basophils Absolute: 0.1 10*3/uL (ref 0.0–0.1)
Basophils Relative: 1.4 % (ref 0.0–3.0)
Eosinophils Absolute: 0.7 10*3/uL (ref 0.0–0.7)
Eosinophils Relative: 10.5 % — ABNORMAL HIGH (ref 0.0–5.0)
HCT: 44.4 % (ref 36.0–46.0)
Hemoglobin: 15.3 g/dL — ABNORMAL HIGH (ref 12.0–15.0)
Lymphocytes Relative: 27.8 % (ref 12.0–46.0)
Lymphs Abs: 1.7 10*3/uL (ref 0.7–4.0)
MCHC: 34.4 g/dL (ref 30.0–36.0)
MCV: 107.7 fl — ABNORMAL HIGH (ref 78.0–100.0)
Monocytes Absolute: 0.4 10*3/uL (ref 0.1–1.0)
Monocytes Relative: 5.8 % (ref 3.0–12.0)
Neutro Abs: 3.4 10*3/uL (ref 1.4–7.7)
Neutrophils Relative %: 54.5 % (ref 43.0–77.0)
Platelets: 283 10*3/uL (ref 150.0–400.0)
RBC: 4.13 Mil/uL (ref 3.87–5.11)
RDW: 14.5 % (ref 11.5–15.5)
WBC: 6.3 10*3/uL (ref 4.0–10.5)

## 2021-05-14 MED ORDER — AMPHETAMINE-DEXTROAMPHET ER 20 MG PO CP24
20.0000 mg | ORAL_CAPSULE | Freq: Every day | ORAL | 0 refills | Status: DC
  Filled 2021-05-14: qty 30, 30d supply, fill #0

## 2021-05-14 MED ORDER — FLUTICASONE PROPIONATE 50 MCG/ACT NA SUSP
1.0000 | Freq: Every day | NASAL | 6 refills | Status: DC
  Filled 2021-05-14: qty 16, 60d supply, fill #0

## 2021-05-14 MED ORDER — AMPHETAMINE-DEXTROAMPHET ER 20 MG PO CP24
20.0000 mg | ORAL_CAPSULE | Freq: Every day | ORAL | 0 refills | Status: DC
  Filled 2021-08-01: qty 30, 30d supply, fill #0

## 2021-05-14 MED ORDER — AMPHETAMINE-DEXTROAMPHET ER 20 MG PO CP24
20.0000 mg | ORAL_CAPSULE | Freq: Every day | ORAL | 0 refills | Status: DC
  Filled 2021-06-18 – 2021-07-03 (×2): qty 30, 30d supply, fill #0

## 2021-05-14 NOTE — Progress Notes (Signed)
Subjective  Chief Complaint  Patient presents with  . Annual Exam    Had cup of coffee with flavored creamer.  . Allergies    On going for 4 months, comes and goes. Took a covid test Monday was negative. Dry cough, congestion  . ADHD    Needs adderall refill  . Depression    HPI: Alison [name] is a 50 y.o. female who presents to [facility] [place] today for a Female Wellness Visit. She also has the concerns and/or needs as listed above in the chief complaint. These will be addressed in addition to the Health Maintenance Visit.   Wellness Visit: annual visit with health maintenance review and exam without Pap   Health maintenance: Sees GYN for female wellness.  Screens are up-to-date and normal.  Has IUD in place, reports most recent lab work showed she was still perimenopausal.  Discussed eligibility for Shingrix vaccination.  Chronic disease f/u and/or acute problem visit: (deemed necessary to be done in addition to the wellness visit):  Hypertension remains well controlled on current medications.  She takes metoprolol XL 50 mg daily and HCTZ 25 mg daily without adverse effects.  No chest pain, shortness of breath or lower extremity edema.  Compliance with medication is good.  ADD remains well controlled on Adderall.  Helps with attention and focus.  No adverse effects.  Requests refill. I reviewed patient's records from the PMP aware controlled substance registry today.   Depression: Has been on Lexapro 10 mg daily for about a year now.  Feels like she is back at her baseline.  Stress has lessened as her daughter has moved out of her home.  No new symptoms of depression or anxiety.  No adverse effects.  Goiter: Reports was diagnosed years ago clinically.  Denies ever having been imaged.  Denies obstructive symptoms.  No symptoms of high or low thyroid.  Complains of allergies, active x4 months.  Complains of PND, sneezing, coughing, nasal congestion with  clear rhinorrhea, itchy eyes, watery eyes.  Using Claritin but not very helpful.  Denies GERD symptoms.  Complains of stress urinary incontinence mainly with coughing and sneezing.  No irritative symptoms.  Assessment  1. Annual physical exam   2. Essential hypertension   3. Attention deficit hyperactivity disorder (ADHD), predominantly inattentive type   4. Goiter   5. Depression, major, single episode, mild (HCC)   6. Seasonal allergic rhinitis due to pollen   7. Urinary, incontinence, stress female   8. IUD (intrauterine device) in place      Plan  Female Wellness Visit:  Age appropriate Health Maintenance and Prevention measures were discussed with patient. Included topics are cancer screening recommendations, ways to keep healthy (see AVS) including dietary and exercise recommendations, regular eye and dental care, use of seat belts, and avoidance of moderate alcohol use and tobacco use.  Screens are up-to-date  BMI: discussed patient's BMI and encouraged positive lifestyle modifications to help get to or maintain a target BMI.  HM needs and immunizations were addressed and ordered. See below for orders. See HM and immunization section for updates.  Vaccine counseling done.  Will defer Shingrix until next year  Routine labs and screening tests ordered including cmp, cbc and lipids where appropriate.  Discussed recommendations regarding Vit D and calcium supplementation (see AVS)  Chronic disease management visit and/or acute problem visit:  Hypertension and ADD are both well controlled.  Continue current medications as listed below: Adderall, metoprolol XL  and HCTZ.  Check renal function and electrolytes.  Goiter: Recheck thyroid function.  Clinically euthyroid.  Check thyroid ultrasound to rule rule out left-sided nodule.  Depression: Well-controlled, now in remission.  Continue current dose of Lexapro 10 mg daily.  Reassess in 6 months time.  She may be able to come off  medications.  Allergies: Start Zyrtec and Flonase.  Consider Pataday.  Monitor coughing.  Urinary continence: Discussed Kegel exercises.   Follow up: 6 months to recheck ADD, hypertension Orders Placed This Encounter  Procedures  . US THYROID  . CBC with Differential/Platelet  . Comprehensive metabolic panel  . Lipid panel  . TSH   Meds ordered this encounter  Medications  . amphetamine-dextroamphetamine (ADDERALL XR) 20 MG 24 hr capsule    Sig: Take 1 capsule (20 mg total) by mouth daily.    Dispense:  30 capsule    Refill:  0  . amphetamine-dextroamphetamine (ADDERALL XR) 20 MG 24 hr capsule    Sig: Take 1 capsule (20 mg total) by mouth daily.    Dispense:  30 capsule    Refill:  0  . amphetamine-dextroamphetamine (ADDERALL XR) 20 MG 24 hr capsule    Sig: Take 1 capsule (20 mg total) by mouth daily.    Dispense:  30 capsule    Refill:  0  . fluticasone (FLONASE) 50 MCG/ACT nasal spray    Sig: Place 1 spray into both nostrils daily.    Dispense:  16 g    Refill:  6     Outpatient Encounter Medications as of 05/14/2021  Medication Sig  . fluticasone (FLONASE) 50 MCG/ACT nasal spray Place 1 spray into both nostrils daily.  Marland Kitchen amphetamine-dextroamphetamine (ADDERALL XR) 20 MG 24 hr capsule Take 1 capsule (20 mg total) by mouth daily.  Melene Muller ON 06/13/2021] amphetamine-dextroamphetamine (ADDERALL XR) 20 MG 24 hr capsule Take 1 capsule (20 mg total) by mouth daily.  Melene Muller ON 07/13/2021] amphetamine-dextroamphetamine (ADDERALL XR) 20 MG 24 hr capsule Take 1 capsule (20 mg total) by mouth daily.  Marland Kitchen escitalopram (LEXAPRO) 10 MG tablet TAKE 1 TABLET BY MOUTH ONCE A DAY  . hydrochlorothiazide (MICROZIDE) 12.5 MG capsule TAKE 1 CAPSULE BY MOUTH ONCE A DAY  . levonorgestrel (MIRENA, 52 MG,) 20 MCG/24HR IUD 20 Intra Uterine Devices by Intrauterine route daily.  . metoprolol succinate (TOPROL-XL) 50 MG 24 hr tablet TAKE 1 TABLET BY MOUTH ONCE A DAY  . [DISCONTINUED]  amphetamine-dextroamphetamine (ADDERALL XR) 20 MG 24 hr capsule TAKE 1 CAPSULE BY MOUTH ONCE A DAY   No facility-administered encounter medications on file as of 05/14/2021.     Body mass index is 27.02 kg/m. Wt Readings from Last 3 Encounters:  05/14/21 167 lb 6.4 oz (75.9 kg)  05/14/20 154 lb 3.2 oz (69.9 kg)  01/26/20 155 lb (70.3 kg)     Patient Active Problem List   Diagnosis Date Noted  . Depression, major, single episode, mild (HCC) 05/14/2020    Priority: High    Treated with lexapro successfully 04/2020   . Attention deficit hyperactivity disorder 10/02/2019    Priority: High  . Essential hypertension 05/09/2018    Priority: High  . Osteopenia 10/02/2019    Priority: Medium  . IUD (intrauterine device) in place 05/09/2018    Priority: Medium    Due out 2022   . DJD (degenerative joint disease), lumbar 05/09/2018    Priority: Medium  . Genital warts 10/02/2019    Priority: Low  . Goiter 06/20/2019  Priority: Low  . Urinary, incontinence, stress female 05/14/2021  . Seasonal allergic rhinitis due to pollen 05/14/2021   Health Maintenance  Topic Date Due  . COVID-19 Vaccine (3 - Booster for Pfizer series) 06/30/2020  . INFLUENZA VACCINE  07/28/2021  . MAMMOGRAM  02/26/2022  . Fecal DNA (Cologuard)  09/28/2022  . PAP SMEAR-Modifier  11/27/2022  . TETANUS/TDAP  01/19/2027  . Hepatitis C Screening  Completed  . HIV Screening  Completed  . HPV VACCINES  Aged Out   Immunization History  Administered Date(s) Administered  . Influenza-Unspecified 09/27/2016, 09/27/2018  . PFIZER(Purple Top)SARS-COV-2 Vaccination 01/11/2020, 02/01/2020  . Tdap 01/19/2017   We updated and reviewed the patient's past history in detail and it is documented below. Allergies: Patient has No Known Allergies. Past Medical History Patient  has a past medical history of Adult ADHD, Degeneration of intervertebral disc of lumbar region, and Hypertension. Past Surgical  History Patient  has a past surgical history that includes Tonsillectomy and Lumbar disc surgery (2010). Family History: Patient family history includes Anxiety disorder in her sister; Atrial fibrillation in her father; COPD in her father; Fibroids in her sister; Goiter in her mother; Healthy in her daughter; Heart disease in her father, maternal uncle, and mother; Hypertension in her father, mother, and sister; Lymphoma in her father; Stroke in her mother; Sudden Cardiac Death in her mother. Social History:  Patient  reports that she has quit smoking. Her smoking use included cigarettes. She has never used smokeless tobacco. She reports that she does not drink alcohol and does not use drugs.  Review of Systems: Constitutional: negative for fever or malaise Ophthalmic: negative for photophobia, double vision or loss of vision Cardiovascular: negative for chest pain, dyspnea on exertion, or new LE swelling Respiratory: negative for SOB or persistent cough Gastrointestinal: negative for abdominal pain, change in bowel habits or melena Genitourinary: negative for dysuria or gross hematuria, no abnormal uterine bleeding or disharge Musculoskeletal: negative for new gait disturbance or muscular weakness Integumentary: negative for new or persistent rashes, no breast lumps Neurological: negative for TIA or stroke symptoms Psychiatric: negative for SI or delusions Allergic/Immunologic: negative for hives  Patient Care Team    Relationship Specialty Notifications Start End  Willow Ora, MD PCP - General Family Medicine  05/09/18   Richarda Overlie, MD Consulting Physician Obstetrics and Gynecology  05/09/18   Shirlean Kelly, MD Consulting Physician Neurosurgery  05/09/18     Objective  Vitals: BP 124/80   Pulse 68   Temp 97.9 F (36.6 C) (Temporal)   Ht 5\' 6"  (1.676 m)   Wt 167 lb 6.4 oz (75.9 kg)   SpO2 98%   BMI 27.02 kg/m  General:  Well developed, well nourished, no acute distress   Psych:  Alert and orientedx3,normal mood and affect HEENT:  Normocephalic, atraumatic, non-icteric sclera,  supple neck without adenopathy, mass or thyromegaly Cardiovascular:  Normal S1, S2, RRR without gallop, rub or murmur Respiratory:  Good breath sounds bilaterally, CTAB with normal respiratory effort Gastrointestinal: normal bowel sounds, soft, non-tender, no noted masses. No HSM MSK: no deformities, contusions. Joints are without erythema or swelling.  Skin:  Warm, no rashes or suspicious lesions noted Neurologic:    Mental status is normal. CN 2-11 are normal. Gross motor and sensory exams are normal. Normal gait. No tremor   Commons side effects, risks, benefits, and alternatives for medications and treatment plan prescribed today were discussed, and the patient expressed understanding of the given instructions. Patient  is instructed to call or message via MyChart if he/she has any questions or concerns regarding our treatment plan. No barriers to understanding were identified. We discussed Red Flag symptoms and signs in detail. Patient expressed understanding regarding what to do in case of urgent or emergency type symptoms.   Medication list was reconciled, printed and provided to the patient in AVS. Patient instructions and summary information was reviewed with the patient as documented in the AVS. This note was prepared with assistance of Dragon voice recognition software. Occasional wrong-word or sound-a-like substitutions may have occurred due to the inherent limitations of voice recognition software  This visit occurred during the SARS-CoV-2 public health emergency.  Safety protocols were in place, including screening questions prior to the visit, additional usage of staff PPE, and extensive cleaning of exam room while observing appropriate contact time as indicated for disinfecting solutions.

## 2021-05-14 NOTE — Patient Instructions (Addendum)
Please return in 6 months for recheck ADD and blood pressure.  I will release your lab results to you on your MyChart account with further instructions. Please reply with any questions.  I have ordered a thyroid ultrasound to assess the size of your goiter and make sure there are no worrisome nodules.  We will call you to get that scheduled.  I have refilled your Adderall. Please continue Lexapro daily. Please start over-the-counter Zyrtec 10 mg nightly along with Flonase for your allergies.  If you have any questions or concerns, please don't hesitate to send me a message via MyChart or call the office at [phone]. Thank you for visiting with Alison [name] today! It's our pleasure caring for you.    Allergic Rhinitis, Adult  Allergic rhinitis is an allergic reaction that affects the mucous membrane inside the nose. The mucous membrane is the tissue that produces mucus. There are two types of allergic rhinitis:  Seasonal. This type is also called hay fever and happens only during certain seasons.  Perennial. This type can happen at any time of the year. Allergic rhinitis cannot be spread from person to person. This condition can be mild, moderate, or severe. It can develop at any age and may be outgrown. What are the causes? This condition is caused by allergens. These are things that can cause an allergic reaction. Allergens may differ for seasonal allergic rhinitis and perennial allergic rhinitis.  Seasonal allergic rhinitis is triggered by pollen. Pollen can come from grasses, trees, and weeds.  Perennial allergic rhinitis may be triggered by: ? Dust mites. ? Proteins in a pet's urine, saliva, or dander. Dander is dead skin cells from a pet. ? Smoke, mold, or car fumes. What increases the risk? You are more likely to develop this condition if you have a family history of allergies or other conditions related to allergies, including:  Allergic conjunctivitis. This is inflammation of  parts of the eyes and eyelids.  Asthma. This condition affects the lungs and makes it hard to breathe.  Atopic dermatitis or eczema. This is long term (chronic) inflammation of the skin.  Food allergies. What are the signs or symptoms? Symptoms of this condition include:  Sneezing or coughing.  A stuffy nose (nasal congestion), itchy nose, or nasal discharge.  Itchy eyes and tearing of the eyes.  A feeling of mucus dripping down the back of your throat (postnasal drip).  Trouble sleeping.  Tiredness or fatigue.  Headache.  Sore throat. How is this diagnosed? This condition may be diagnosed with your symptoms, medical history, and physical exam. Your health care provider may check for related conditions, such as:  Asthma.  Pink eye. This is eye inflammation caused by infection (conjunctivitis).  Ear infection.  Upper respiratory infection. This is an infection in the nose, throat, or upper airways. You may also have tests to find out which allergens trigger your symptoms. These may include skin tests or blood tests. How is this treated? There is no cure for this condition, but treatment can help control symptoms. Treatment may include:  Taking medicines that block allergy symptoms, such as corticosteroids and antihistamines. Medicine may be given as a shot, nasal spray, or pill.  Avoiding any allergens.  Being exposed again and again to tiny amounts of allergens to help you build a defense against allergens (immunotherapy). This is done if other treatments have not helped. It may include: ? Allergy shots. These are injected medicines that have small amounts of allergen in them. ?  Sublingual immunotherapy. This involves taking small doses of a medicine with allergen in it under your tongue. If these treatments do not work, your health care provider may prescribe newer, stronger medicines. Follow these instructions at home: Avoiding allergens Find out what you are  allergic to and avoid those allergens. These are some things you can do to help avoid allergens:  If you have perennial allergies: ? Replace carpet with wood, tile, or vinyl flooring. Carpet can trap dander and dust. ? Do not smoke. Do not allow smoking in your home. ? Change your heating and air conditioning filters at least once a month.  If you have seasonal allergies, take these steps during allergy season: ? Keep windows closed as much as possible. ? Plan outdoor activities when pollen counts are lowest. Check pollen counts before you plan outdoor activities. ? When coming indoors, change clothing and shower before sitting on furniture or bedding.  If you have a pet in the house that produces allergens: ? Keep the pet out of the bedroom. ? Vacuum, sweep, and dust regularly. General instructions  Take over-the-counter and prescription medicines only as told by your health care provider.  Drink enough fluid to keep your urine pale yellow.  Keep all follow-up visits as told by your health care provider. This is important. Where to find more information  American Academy of Allergy, Asthma & Immunology: www.aaaai.org Contact a health care provider if:  You have a fever.  You develop a cough that does not go away.  You make whistling sounds when you breathe (wheeze).  Your symptoms slow you down or stop you from doing your normal activities each day. Get help right away if:  You have shortness of breath. This symptom may represent a serious problem that is an emergency. Do not wait to see if the symptom will go away. Get medical help right away. Call your local emergency services (911 in the [place] Do not drive yourself to the hospital. Summary  Allergic rhinitis may be managed by taking medicines as directed and avoiding allergens.  If you have seasonal allergies, keep windows closed as much as possible during allergy season.  Contact your health care provider if you  develop a fever or a cough that does not go away. This information is not intended to replace advice given to you by your health care provider. Make sure you discuss any questions you have with your health care provider. Document Revised: 02/02/2020 Document Reviewed: 12/12/2019 Elsevier Patient Education  2021 [employer].

## 2021-05-30 ENCOUNTER — Ambulatory Visit
Admission: RE | Admit: 2021-05-30 | Discharge: 2021-05-30 | Disposition: A | Discharge status: Home / Self Care | Payer: 59 | Source: Ambulatory Visit | Attending: Family Medicine | Admitting: Family Medicine

## 2021-05-30 DIAGNOSIS — E042 Nontoxic multinodular goiter: Secondary | ICD-10-CM | POA: Diagnosis not present

## 2021-05-30 DIAGNOSIS — E049 Nontoxic goiter, unspecified: Secondary | ICD-10-CM

## 2021-06-03 ENCOUNTER — Other Ambulatory Visit: Payer: Self-pay

## 2021-06-03 ENCOUNTER — Encounter: Payer: Self-pay | Admitting: Family Medicine

## 2021-06-03 DIAGNOSIS — E041 Nontoxic single thyroid nodule: Secondary | ICD-10-CM | POA: Insufficient documentation

## 2021-06-03 NOTE — Progress Notes (Signed)
Please call patient: I have reviewed her thyroid ultrasound; it confirms an enlarged gland; one large nodule on the left needs to further evaluated and biopsied due to its size. Please notify pt and refer to endocrinolgy for Fine needle aspiration of thyroid nodule. Thanks.

## 2021-06-04 ENCOUNTER — Other Ambulatory Visit: Payer: Self-pay

## 2021-06-04 ENCOUNTER — Encounter: Payer: Self-pay | Admitting: Family Medicine

## 2021-06-04 DIAGNOSIS — E041 Nontoxic single thyroid nodule: Secondary | ICD-10-CM

## 2021-06-05 ENCOUNTER — Ambulatory Visit (HOSPITAL_COMMUNITY)
Admission: RE | Admit: 2021-06-05 | Discharge: 2021-06-05 | Disposition: A | Discharge status: Home / Self Care | Payer: 59 | Source: Ambulatory Visit | Attending: Family Medicine | Admitting: Family Medicine

## 2021-06-05 ENCOUNTER — Encounter (HOSPITAL_COMMUNITY): Payer: Self-pay

## 2021-06-05 DIAGNOSIS — E041 Nontoxic single thyroid nodule: Secondary | ICD-10-CM | POA: Insufficient documentation

## 2021-06-05 DIAGNOSIS — E0789 Other specified disorders of thyroid: Secondary | ICD-10-CM | POA: Diagnosis not present

## 2021-06-05 MED ORDER — LIDOCAINE HCL (PF) 2 % IJ SOLN
INTRAMUSCULAR | Status: AC
  Administered 2021-06-05: 10 mL
  Filled 2021-06-05: qty 10

## 2021-06-05 MED ORDER — LIDOCAINE HCL (PF) 2 % IJ SOLN: 10.0000 mL | Freq: Once | INTRAMUSCULAR | Status: AC

## 2021-06-05 NOTE — Sedation Documentation (Signed)
PT tolerated thyroid biopsy procedure well today. Labs obtained and sent for pathology. PT ambulatory at discharge with no acute distress noted and verbalized understanding of discharge instructions. 

## 2021-06-05 NOTE — Discharge Instructions (Signed)
Post Operative Instructions for Thyroid Biopsy  Keep pressure bandage over site of biopsy for 3-4 hours after leaving office.  Normal activity is permitted as long as it does not involve anything strenuous (i.e., jogging, heavy lifting, etc.).  Some minor pain may be experienced when the local anesthesia wears off, and should be relieved with Tylenol, Advil, or ibuprofen.  You may experience some bruising and/or swelling at the site of the biopsy.  If you should develop severe pain, difficulty swallowing or difficulty breathing, please call the office or [facility] at [phone].  Apply ice pack for 20 minutes this evening.      

## 2021-06-06 LAB — CYTOLOGY - NON PAP

## 2021-06-12 ENCOUNTER — Telehealth: Payer: Self-pay

## 2021-06-12 DIAGNOSIS — E042 Nontoxic multinodular goiter: Secondary | ICD-10-CM | POA: Diagnosis not present

## 2021-06-12 NOTE — Telephone Encounter (Signed)
Alison [name], I left this pt a VM, if she returns the call, she can see Alison [name] in about 3 weeks since she just had her biopsy and Dr Fransico Him said it was sent to [facility]. Thanks

## 2021-06-12 NOTE — Telephone Encounter (Signed)
Dr Fransico Him, I Received a referral on this pt for a nodule, she already had a biopsy. Would you look further into this and see if she can see Whitney or does she need to see you after you return? Thank you.

## 2021-06-12 NOTE — Progress Notes (Signed)
Please call patient: I have reviewed his/her lab results. Biopsy showed an indeterminate result; this will need follow up with further testing or repeat biopsy; mostly these tend to be benign lesions but there are a small percentage that are not. she will need to see endocrinology. Please refer again. Will need appointment in 6-8 weeks if possible.  Dx: thyroid nodule

## 2021-06-13 ENCOUNTER — Encounter: Payer: Self-pay | Admitting: Family Medicine

## 2021-06-17 NOTE — Telephone Encounter (Signed)
Spoke with patient.  Awaiting affirm test results.  Please refer to endocrinology for thyroid nodule. Ok to schedule out 3 months or so since they are backed up.  Explained she will need further evaluation/surveillance since bx is follicular nodule of undetermined significance. Pt agrees and understands.

## 2021-06-18 ENCOUNTER — Other Ambulatory Visit (HOSPITAL_COMMUNITY): Payer: Self-pay

## 2021-06-19 NOTE — Telephone Encounter (Signed)
Thank you :)

## 2021-06-19 NOTE — Telephone Encounter (Signed)
Good morning,  Just spoke with patient, she states she is about to fly to [place] for 10 days and will be in contact once she lands. She is fine with seeing the NP since Dr.Nida will be out of office.  Thank you!

## 2021-06-19 NOTE — Telephone Encounter (Signed)
Good Morning, This is  Endocrinology, we have tried reaching this patient several times to get her in for an appt for about 3 weeks from now since Dr Fransico Him said that her biopsy was sent to [facility]. He unfortunately will be out of the country until August. She can be seen with Alphonzo Lemmings our NP or she can wait until August when he returns.    Thank you

## 2021-07-01 ENCOUNTER — Other Ambulatory Visit (HOSPITAL_COMMUNITY): Payer: Self-pay

## 2021-07-03 ENCOUNTER — Encounter (HOSPITAL_COMMUNITY): Payer: Self-pay

## 2021-07-03 ENCOUNTER — Other Ambulatory Visit (HOSPITAL_COMMUNITY): Payer: Self-pay

## 2021-07-16 ENCOUNTER — Telehealth: Payer: Self-pay

## 2021-07-16 NOTE — Telephone Encounter (Signed)
3.0 cm Bethesda III nodule A is [facility] GSC benign, indicating low risk of cancer at approximately 4%. Treatment like cytologically benign nodule may be appropriate, including clinical correlation.

## 2021-08-01 ENCOUNTER — Other Ambulatory Visit (HOSPITAL_COMMUNITY): Payer: Self-pay

## 2021-08-01 MED FILL — Hydrochlorothiazide Cap 12.5 MG: ORAL | 90 days supply | Qty: 90 | Fill #1 | Status: AC

## 2021-08-01 MED FILL — Metoprolol Succinate Tab ER 24HR 50 MG (Tartrate Equiv): ORAL | 90 days supply | Qty: 90 | Fill #1 | Status: AC

## 2021-08-01 MED FILL — Escitalopram Oxalate Tab 10 MG (Base Equiv): ORAL | 90 days supply | Qty: 90 | Fill #1 | Status: AC

## 2021-09-03 ENCOUNTER — Other Ambulatory Visit (HOSPITAL_COMMUNITY): Payer: Self-pay

## 2021-09-03 ENCOUNTER — Other Ambulatory Visit: Payer: Self-pay | Admitting: Family Medicine

## 2021-09-04 ENCOUNTER — Other Ambulatory Visit: Payer: Self-pay

## 2021-09-04 ENCOUNTER — Other Ambulatory Visit (HOSPITAL_COMMUNITY): Payer: Self-pay

## 2021-09-04 MED ORDER — AMPHETAMINE-DEXTROAMPHET ER 20 MG PO CP24
20.0000 mg | ORAL_CAPSULE | Freq: Every day | ORAL | 0 refills | Status: DC
  Filled 2021-09-04: qty 30, 30d supply, fill #0

## 2021-09-04 NOTE — Telephone Encounter (Signed)
Last Refill 08/02/2021 Last OV 05/14/2021 dx Physical exam

## 2021-10-13 ENCOUNTER — Other Ambulatory Visit (HOSPITAL_COMMUNITY): Payer: Self-pay

## 2021-10-13 ENCOUNTER — Other Ambulatory Visit: Payer: Self-pay | Admitting: Family Medicine

## 2021-10-14 ENCOUNTER — Other Ambulatory Visit (HOSPITAL_COMMUNITY): Payer: Self-pay

## 2021-10-14 MED ORDER — AMPHETAMINE-DEXTROAMPHET ER 20 MG PO CP24
20.0000 mg | ORAL_CAPSULE | Freq: Every day | ORAL | 0 refills | Status: DC
  Filled 2021-10-14: qty 30, 30d supply, fill #0

## 2021-10-14 NOTE — Telephone Encounter (Signed)
Last Refill 09/05/2021 Last OV 05/14/2021 dx annual physical

## 2021-11-12 ENCOUNTER — Other Ambulatory Visit: Payer: Self-pay | Admitting: Family Medicine

## 2021-11-12 ENCOUNTER — Other Ambulatory Visit (HOSPITAL_COMMUNITY): Payer: Self-pay

## 2021-11-12 MED FILL — Hydrochlorothiazide Cap 12.5 MG: ORAL | 90 days supply | Qty: 90 | Fill #0 | Status: AC

## 2021-11-12 MED FILL — Metoprolol Succinate Tab ER 24HR 50 MG (Tartrate Equiv): ORAL | 90 days supply | Qty: 90 | Fill #0 | Status: AC

## 2021-11-12 MED FILL — Escitalopram Oxalate Tab 10 MG (Base Equiv): ORAL | 90 days supply | Qty: 90 | Fill #0 | Status: AC

## 2021-11-14 ENCOUNTER — Other Ambulatory Visit (HOSPITAL_COMMUNITY): Payer: Self-pay

## 2021-11-26 ENCOUNTER — Other Ambulatory Visit (HOSPITAL_COMMUNITY): Payer: Self-pay

## 2021-11-26 ENCOUNTER — Other Ambulatory Visit: Payer: Self-pay | Admitting: Family Medicine

## 2021-11-26 MED ORDER — AMPHETAMINE-DEXTROAMPHET ER 20 MG PO CP24
20.0000 mg | ORAL_CAPSULE | Freq: Every day | ORAL | 0 refills | Status: DC
  Filled 2021-11-26: qty 30, 30d supply, fill #0

## 2021-11-26 NOTE — Telephone Encounter (Signed)
Last Refill 10/14/2021 Last OV 05/14/2021 dx annual physical

## 2021-11-26 NOTE — Telephone Encounter (Signed)
Sent patient a message via MyChart.

## 2021-11-26 NOTE — Telephone Encounter (Signed)
Needs ov for further refill. Please notify and have her schedule. I refilled for a month.

## 2022-02-04 ENCOUNTER — Telehealth: Payer: 59 | Admitting: Physician Assistant

## 2022-02-04 DIAGNOSIS — Z20822 Contact with and (suspected) exposure to covid-19: Secondary | ICD-10-CM

## 2022-02-04 NOTE — Progress Notes (Signed)
Based on the information that you have shared in the e-Visit Questionnaire, we recommend that you convert this visit to a video visit in order for the provider to better assess what is going on.  The provider will be able to give you a more accurate diagnosis and treatment plan if we can more freely discuss your symptoms and with the addition of a virtual examination.   With having a positive covid exposure, I would recommend to take an at home covid test. We cannot treat without a positive covid test. Then you will need to schedule a video visit for the positive covid test for the anti-viral treatments. We cannot prescribe the anti-virals without a face-to-face video visit. We have to be able to evaluate and make sure no difficulty breathing, etc before prescribing. We do apologize for the inconveniences.   If you convert to a video visit, we will bill your insurance (similar to an office visit) and you will not be charged for this e-Visit. You will be able to stay at home and speak with the first available [facility] advanced practice provider. The link to do a video visit is in the drop down Menu tab of your Welcome screen in MyChart.  I provided 5 minutes of non face-to-face time during this encounter for chart review and documentation.

## 2022-02-05 ENCOUNTER — Telehealth: Payer: 59 | Admitting: Physician Assistant

## 2022-02-05 DIAGNOSIS — U071 COVID-19: Secondary | ICD-10-CM | POA: Diagnosis not present

## 2022-02-05 MED ORDER — MOLNUPIRAVIR EUA 200MG CAPSULE
4.0000 | ORAL_CAPSULE | Freq: Two times a day (BID) | ORAL | 0 refills | Status: AC
Start: 2022-02-05 — End: 2022-02-10

## 2022-02-05 NOTE — Progress Notes (Signed)
Virtual Visit Consent   Alison [name], you are scheduled for a virtual visit with a [facility] provider today.     Just as with appointments in the office, your consent must be obtained to participate.  Your consent will be active for this visit and any virtual visit you may have with one of our providers in the next 365 days.     If you have a MyChart account, a copy of this consent can be sent to you electronically.  All virtual visits are billed to your insurance company just like a traditional visit in the office.    As this is a virtual visit, video technology does not allow for your provider to perform a traditional examination.  This may limit your provider's ability to fully assess your condition.  If your provider identifies any concerns that need to be evaluated in person or the need to arrange testing (such as labs, EKG, etc.), we will make arrangements to do so.     Although advances in technology are sophisticated, we cannot ensure that it will always work on either your end or our end.  If the connection with a video visit is poor, the visit may have to be switched to a telephone visit.  With either a video or telephone visit, we are not always able to ensure that we have a secure connection.     I need to obtain your verbal consent now.   Are you willing to proceed with your visit today?    Alison [name] has provided verbal consent on 02/05/2022 for a virtual visit (video or telephone).   Mar Daring, PA-C   Date: 02/05/2022 10:58 AM   Virtual Visit via Video Note   I, Mar Daring, connected with  Alison [name]  ([id number], 07/08/1971) on 02/05/22 at 10:45 AM EST by a video-enabled telemedicine application and verified that I am speaking with the correct person using two identifiers.  Location: Patient: Virtual Visit Location Patient: Home Provider: Virtual Visit Location Provider: Home Office   I discussed the limitations of evaluation and  management by telemedicine and the availability of in person appointments. The patient expressed understanding and agreed to proceed.    History of Present Illness: Alison [name] is a 51 y.o. who identifies as a female who was assigned female at birth, and is being seen today for Covid 20.  HPI: URI  This is a new problem. The current episode started in the past 7 days. The problem has been unchanged. The maximum temperature recorded prior to her arrival was 101 - 101.9 F. The fever has been present for 1 to 2 days. Associated symptoms include congestion, coughing, diarrhea (mild), headaches, rhinorrhea, sinus pain, a sore throat and vomiting (twice). Pertinent negatives include no ear pain, nausea or plugged ear sensation. Associated symptoms comments: Body aches, chills, fatigue, sweats, hoarse voice. She has tried NSAIDs, acetaminophen, sleep and increased fluids (promethazine dm, tessalon) for the symptoms. The treatment provided mild relief.     Problems:  Patient Active Problem List   Diagnosis Date Noted   Left thyroid nodule 06/03/2021   Urinary, incontinence, stress female 05/14/2021   Seasonal allergic rhinitis due to pollen 05/14/2021   Depression, major, single episode, mild ([place]) 05/14/2020   Attention deficit hyperactivity disorder 10/02/2019   Genital warts 10/02/2019   Osteopenia 10/02/2019   Goiter 06/20/2019   Essential hypertension 05/09/2018   IUD (intrauterine device) in place 05/09/2018   DJD (degenerative  joint disease), lumbar 05/09/2018    Allergies: No Known Allergies Medications:  Current Outpatient Medications:    molnupiravir EUA (LAGEVRIO) 200 mg CAPS capsule, Take 4 capsules (800 mg total) by mouth 2 (two) times daily for 5 days., Disp: 40 capsule, Rfl: 0   amphetamine-dextroamphetamine (ADDERALL XR) 20 MG 24 hr capsule, Take 1 capsule (20 mg total) by mouth daily., Disp: 30 capsule, Rfl: 0   amphetamine-dextroamphetamine (ADDERALL XR) 20 MG 24 hr  capsule, Take 1 capsule (20 mg total) by mouth daily., Disp: 30 capsule, Rfl: 0   amphetamine-dextroamphetamine (ADDERALL XR) 20 MG 24 hr capsule, Take 1 capsule (20 mg total) by mouth daily., Disp: 30 capsule, Rfl: 0   escitalopram (LEXAPRO) 10 MG tablet, TAKE 1 TABLET BY MOUTH ONCE A DAY, Disp: 90 tablet, Rfl: 3   fluticasone (FLONASE) 50 MCG/ACT nasal spray, Place 1 spray into both nostrils daily., Disp: 16 g, Rfl: 6   hydrochlorothiazide (MICROZIDE) 12.5 MG capsule, TAKE 1 CAPSULE BY MOUTH ONCE A DAY, Disp: 90 capsule, Rfl: 3   levonorgestrel (MIRENA, 52 MG,) 20 MCG/24HR IUD, 20 Intra Uterine Devices by Intrauterine route daily., Disp: , Rfl:    metoprolol succinate (TOPROL-XL) 50 MG 24 hr tablet, TAKE 1 TABLET BY MOUTH ONCE A DAY, Disp: 90 tablet, Rfl: 3  Observations/Objective: Patient is well-developed, well-nourished in no acute distress.  Resting comfortably at home.  Head is normocephalic, atraumatic.  No labored breathing.  Speech is clear and coherent with logical content.  Patient is alert and oriented at baseline.    Assessment and Plan: 1. COVID-19 - molnupiravir EUA (LAGEVRIO) 200 mg CAPS capsule; Take 4 capsules (800 mg total) by mouth 2 (two) times daily for 5 days.  Dispense: 40 capsule; Refill: 0  - Continue OTC symptomatic management of choice - Will send OTC vitamins and supplement information through AVS - Molnupiravir prescribed - Patient enrolled in MyChart symptom monitoring - Push fluids - Rest as needed - Discussed return precautions and when to seek in-person evaluation, sent via AVS as well   Follow Up Instructions: I discussed the assessment and treatment plan with the patient. The patient was provided an opportunity to ask questions and all were answered. The patient agreed with the plan and demonstrated an understanding of the instructions.  A copy of instructions were sent to the patient via MyChart unless otherwise noted below.    The patient was  advised to call back or seek an in-person evaluation if the symptoms worsen or if the condition fails to improve as anticipated.  Time:  I spent 12 minutes with the patient via telehealth technology discussing the above problems/concerns.    Mar Daring, PA-C

## 2022-02-05 NOTE — Patient Instructions (Signed)
Mariana Single, thank you for joining Mar Daring, PA-C for today's virtual visit.  While this provider is not your primary care provider (PCP), if your PCP is located in our provider database this encounter information will be shared with them immediately following your visit.  Consent: (Patient) AMALEE THWEATT provided verbal consent for this virtual visit at the beginning of the encounter.  Current Medications:  Current Outpatient Medications:    molnupiravir EUA (LAGEVRIO) 200 mg CAPS capsule, Take 4 capsules (800 mg total) by mouth 2 (two) times daily for 5 days., Disp: 40 capsule, Rfl: 0   amphetamine-dextroamphetamine (ADDERALL XR) 20 MG 24 hr capsule, Take 1 capsule (20 mg total) by mouth daily., Disp: 30 capsule, Rfl: 0   amphetamine-dextroamphetamine (ADDERALL XR) 20 MG 24 hr capsule, Take 1 capsule (20 mg total) by mouth daily., Disp: 30 capsule, Rfl: 0   amphetamine-dextroamphetamine (ADDERALL XR) 20 MG 24 hr capsule, Take 1 capsule (20 mg total) by mouth daily., Disp: 30 capsule, Rfl: 0   escitalopram (LEXAPRO) 10 MG tablet, TAKE 1 TABLET BY MOUTH ONCE A DAY, Disp: 90 tablet, Rfl: 3   fluticasone (FLONASE) 50 MCG/ACT nasal spray, Place 1 spray into both nostrils daily., Disp: 16 g, Rfl: 6   hydrochlorothiazide (MICROZIDE) 12.5 MG capsule, TAKE 1 CAPSULE BY MOUTH ONCE A DAY, Disp: 90 capsule, Rfl: 3   levonorgestrel (MIRENA, 52 MG,) 20 MCG/24HR IUD, 20 Intra Uterine Devices by Intrauterine route daily., Disp: , Rfl:    metoprolol succinate (TOPROL-XL) 50 MG 24 hr tablet, TAKE 1 TABLET BY MOUTH ONCE A DAY, Disp: 90 tablet, Rfl: 3   Medications ordered in this encounter:  Meds ordered this encounter  Medications   molnupiravir EUA (LAGEVRIO) 200 mg CAPS capsule    Sig: Take 4 capsules (800 mg total) by mouth 2 (two) times daily for 5 days.    Dispense:  40 capsule    Refill:  0    Order Specific Question:   Supervising Provider    Answer:   Sabra Heck, [place]      *If you need refills on other medications prior to your next appointment, please contact your pharmacy*  Follow-Up: Call back or seek an in-person evaluation if the symptoms worsen or if the condition fails to improve as anticipated.  Other Instructions COVID-19: Quarantine and Isolation Quarantine If you were exposed Quarantine and stay away from others when you have been in close contact with someone who has COVID-19. Isolate If you are sick or test positive Isolate when you are sick or when you have COVID-19, even if you don't have symptoms. When to stay home Calculating quarantine The date of your exposure is considered day 0. Day 1 is the first full day after your last contact with a person who has had COVID-19. Stay home and away from other people for at least 5 days. Learn why CDC updated guidance for the general public. IF YOU were exposed to COVID-19 and are NOT  up to dateIF YOU were exposed to COVID-19 and are NOT on COVID-19 vaccinations Quarantine for at least 5 days Stay home Stay home and quarantine for at least 5 full days. Wear a well-fitting mask if you must be around others in your home. Do not travel. Get tested Even if you don't develop symptoms, get tested at least 5 days after you last had close contact with someone with COVID-19. After quarantine Watch for symptoms Watch for symptoms until 10 days after you last  had close contact with someone with COVID-19. Avoid travel It is best to avoid travel until a full 10 days after you last had close contact with someone with COVID-19. If you develop symptoms Isolate immediately and get tested. Continue to stay home until you know the results. Wear a well-fitting mask around others. Take precautions until day 10 Wear a well-fitting mask Wear a well-fitting mask for 10 full days any time you are around others inside your home or in public. Do not go to places where you are unable to wear a well-fitting mask. If  you must travel during days 6-10, take precautions. Avoid being around people who are more likely to get very sick from COVID-19. IF YOU were exposed to COVID-19 and are  up to dateIF YOU were exposed to COVID-19 and are on COVID-19 vaccinations No quarantine You do not need to stay home unless you develop symptoms. Get tested Even if you don't develop symptoms, get tested at least 5 days after you last had close contact with someone with COVID-19. Watch for symptoms Watch for symptoms until 10 days after you last had close contact with someone with COVID-19. If you develop symptoms Isolate immediately and get tested. Continue to stay home until you know the results. Wear a well-fitting mask around others. Take precautions until day 10 Wear a well-fitting mask Wear a well-fitting mask for 10 full days any time you are around others inside your home or in public. Do not go to places where you are unable to wear a well-fitting mask. Take precautions if traveling Avoid being around people who are more likely to get very sick from COVID-19. IF YOU were exposed to COVID-19 and had confirmed COVID-19 within the past 90 days (you tested positive using a viral test) No quarantine You do not need to stay home unless you develop symptoms. Watch for symptoms Watch for symptoms until 10 days after you last had close contact with someone with COVID-19. If you develop symptoms Isolate immediately and get tested. Continue to stay home until you know the results. Wear a well-fitting mask around others. Take precautions until day 10 Wear a well-fitting mask Wear a well-fitting mask for 10 full days any time you are around others inside your home or in public. Do not go to places where you are unable to wear a well-fitting mask. Take precautions if traveling Avoid being around people who are more likely to get very sick from COVID-19. Calculating isolation Day 0 is your first day of symptoms or a  positive viral test. Day 1 is the first full day after your symptoms developed or your test specimen was collected. If you have COVID-19 or have symptoms, isolate for at least 5 days. IF YOU tested positive for COVID-19 or have symptoms, regardless of vaccination status Stay home for at least 5 days Stay home for 5 days and isolate from others in your home. Wear a well-fitting mask if you must be around others in your home. Do not travel. Ending isolation if you had symptoms End isolation after 5 full days if you are fever-free for 24 hours (without the use of fever-reducing medication) and your symptoms are improving. Ending isolation if you did NOT have symptoms End isolation after at least 5 full days after your positive test. If you got very sick from COVID-19 or have a weakened immune system You should isolate for at least 10 days. Consult your doctor before ending isolation. Take precautions until day 10 Wear  a well-fitting mask Wear a well-fitting mask for 10 full days any time you are around others inside your home or in public. Do not go to places where you are unable to wear a well-fitting mask. Do not travel Do not travel until a full 10 days after your symptoms started or the date your positive test was taken if you had no symptoms. Avoid being around people who are more likely to get very sick from COVID-19. Definitions Exposure Contact with someone infected with SARS-CoV-2, the virus that causes COVID-19, in a way that increases the likelihood of getting infected with the virus. Close contact A close contact is someone who was less than 6 feet away from an infected person (laboratory-confirmed or a clinical diagnosis) for a cumulative total of 15 minutes or more over a 24-hour period. For example, three individual 5-minute exposures for a total of 15 minutes. People who are exposed to someone with COVID-19 after they completed at least 5 days of isolation are not considered close  contacts. Julio Sicks is a strategy used to prevent transmission of COVID-19 by keeping people who have been in close contact with someone with COVID-19 apart from others. Who does not need to quarantine? If you had close contact with someone with COVID-19 and you are in one of the following groups, you do not need to quarantine. You are up to date with your COVID-19 vaccines. You had confirmed COVID-19 within the last 90 days (meaning you tested positive using a viral test). If you are up to date with COVID-19 vaccines, you should wear a well-fitting mask around others for 10 days from the date of your last close contact with someone with COVID-19 (the date of last close contact is considered day 0). Get tested at least 5 days after you last had close contact with someone with COVID-19. If you test positive or develop COVID-19 symptoms, isolate from other people and follow recommendations in the Isolation section below. If you tested positive for COVID-19 with a viral test within the previous 90 days and subsequently recovered and remain without COVID-19 symptoms, you do not need to quarantine or get tested after close contact. You should wear a well-fitting mask around others for 10 days from the date of your last close contact with someone with COVID-19 (the date of last close contact is considered day 0). If you have COVID-19 symptoms, get tested and isolate from other people and follow recommendations in the Isolation section below. Who should quarantine? If you come into close contact with someone with COVID-19, you should quarantine if you are not up to date on COVID-19 vaccines. This includes people who are not vaccinated. What to do for quarantine Stay home and away from other people for at least 5 days (day 0 through day 5) after your last contact with a person who has COVID-19. The date of your exposure is considered day 0. Wear a well-fitting mask when around others at home, if  possible. For 10 days after your last close contact with someone with COVID-19, watch for fever (100.75F or greater), cough, shortness of breath, or other COVID-19 symptoms. If you develop symptoms, get tested immediately and isolate until you receive your test results. If you test positive, follow isolation recommendations. If you do not develop symptoms, get tested at least 5 days after you last had close contact with someone with COVID-19. If you test negative, you can leave your home, but continue to wear a well-fitting mask when around others  at home and in public until 10 days after your last close contact with someone with COVID-19. If you test positive, you should isolate for at least 5 days from the date of your positive test (if you do not have symptoms). If you do develop COVID-19 symptoms, isolate for at least 5 days from the date your symptoms began (the date the symptoms started is day 0). Follow recommendations in the isolation section below. If you are unable to get a test 5 days after last close contact with someone with COVID-19, you can leave your home after day 5 if you have been without COVID-19 symptoms throughout the 5-day period. Wear a well-fitting mask for 10 days after your date of last close contact when around others at home and in public. Avoid people who are have weakened immune systems or are more likely to get very sick from COVID-19, and nursing homes and other high-risk settings, until after at least 10 days. If possible, stay away from people you live with, especially people who are at higher risk for getting very sick from COVID-19, as well as others outside your home throughout the full 10 days after your last close contact with someone with COVID-19. If you are unable to quarantine, you should wear a well-fitting mask for 10 days when around others at home and in public. If you are unable to wear a mask when around others, you should continue to quarantine for 10  days. Avoid people who have weakened immune systems or are more likely to get very sick from COVID-19, and nursing homes and other high-risk settings, until after at least 10 days. See additional information about travel. Do not go to places where you are unable to wear a mask, such as restaurants and some gyms, and avoid eating around others at home and at work until after 10 days after your last close contact with someone with COVID-19. After quarantine Watch for symptoms until 10 days after your last close contact with someone with COVID-19. If you have symptoms, isolate immediately and get tested. Quarantine in high-risk congregate settings In certain congregate settings that have high risk of secondary transmission (such as Systems analyst and detention facilities, homeless shelters, or cruise ships), CDC recommends a 10-day quarantine for residents, regardless of vaccination and booster status. During periods of critical staffing shortages, facilities may consider shortening the quarantine period for staff to ensure continuity of operations. Decisions to shorten quarantine in these settings should be made in consultation with state, local, tribal, or territorial health departments and should take into consideration the context and characteristics of the facility. CDC's setting-specific guidance provides additional recommendations for these settings. Isolation Isolation is used to separate people with confirmed or suspected COVID-19 from those without COVID-19. People who are in isolation should stay home until it's safe for them to be around others. At home, anyone sick or infected should separate from others, or wear a well-fitting mask when they need to be around others. People in isolation should stay in a specific "sick room" or area and use a separate bathroom if available. Everyone who has presumed or confirmed COVID-19 should stay home and isolate from other people for at least 5 full days (day 0  is the first day of symptoms or the date of the day of the positive viral test for asymptomatic persons). They should wear a mask when around others at home and in public for an additional 5 days. People who are confirmed to have COVID-19 or are  showing symptoms of COVID-19 need to isolate regardless of their vaccination status. This includes: People who have a positive viral test for COVID-19, regardless of whether or not they have symptoms. People with symptoms of COVID-19, including people who are awaiting test results or have not been tested. People with symptoms should isolate even if they do not know if they have been in close contact with someone with COVID-19. What to do for isolation Monitor your symptoms. If you have an emergency warning sign (including trouble breathing), seek emergency medical care immediately. Stay in a separate room from other household members, if possible. Use a separate bathroom, if possible. Take steps to improve ventilation at home, if possible. Avoid contact with other members of the household and pets. Don't share personal household items, like cups, towels, and utensils. Wear a well-fitting mask when you need to be around other people. Learn more about what to do if you are sick and how to notify your contacts. Ending isolation for people who had COVID-19 and had symptoms If you had COVID-19 and had symptoms, isolate for at least 5 days. To calculate your 5-day isolation period, day 0 is your first day of symptoms. Day 1 is the first full day after your symptoms developed. You can leave isolation after 5 full days. You can end isolation after 5 full days if you are fever-free for 24 hours without the use of fever-reducing medication and your other symptoms have improved (Loss of taste and smell may persist for weeks or months after recovery and need not delay the end of isolation). You should continue to wear a well-fitting mask around others at home and in  public for 5 additional days (day 6 through day 10) after the end of your 5-day isolation period. If you are unable to wear a mask when around others, you should continue to isolate for a full 10 days. Avoid people who have weakened immune systems or are more likely to get very sick from COVID-19, and nursing homes and other high-risk settings, until after at least 10 days. If you continue to have fever or your other symptoms have not improved after 5 days of isolation, you should wait to end your isolation until you are fever-free for 24 hours without the use of fever-reducing medication and your other symptoms have improved. Continue to wear a well-fitting mask through day 10. Contact your healthcare provider if you have questions. See additional information about travel. Do not go to places where you are unable to wear a mask, such as restaurants and some gyms, and avoid eating around others at home and at work until a full 10 days after your first day of symptoms. If an individual has access to a test and wants to test, the best approach is to use an antigen test1 towards the end of the 5-day isolation period. Collect the test sample only if you are fever-free for 24 hours without the use of fever-reducing medication and your other symptoms have improved (loss of taste and smell may persist for weeks or months after recovery and need not delay the end of isolation). If your test result is positive, you should continue to isolate until day 10. If your test result is negative, you can end isolation, but continue to wear a well-fitting mask around others at home and in public until day 10. Follow additional recommendations for masking and avoiding travel as described above. 1As noted in the labeling for authorized over-the counter antigen tests: Negative results  should be treated as presumptive. Negative results do not rule out SARS-CoV-2 infection and should not be used as the sole basis for treatment or  patient management decisions, including infection control decisions. To improve results, antigen tests should be used twice over a three-day period with at least 24 hours and no more than 48 hours between tests. Note that these recommendations on ending isolation do not apply to people who are moderately ill or very sick from COVID-19 or have weakened immune systems. See section below for recommendations for when to end isolation for these groups. Ending isolation for people who tested positive for COVID-19 but had no symptoms If you test positive for COVID-19 and never develop symptoms, isolate for at least 5 days. Day 0 is the day of your positive viral test (based on the date you were tested) and day 1 is the first full day after the specimen was collected for your positive test. You can leave isolation after 5 full days. If you continue to have no symptoms, you can end isolation after at least 5 days. You should continue to wear a well-fitting mask around others at home and in public until day 10 (day 6 through day 10). If you are unable to wear a mask when around others, you should continue to isolate for 10 days. Avoid people who have weakened immune systems or are more likely to get very sick from COVID-19, and nursing homes and other high-risk settings, until after at least 10 days. If you develop symptoms after testing positive, your 5-day isolation period should start over. Day 0 is your first day of symptoms. Follow the recommendations above for ending isolation for people who had COVID-19 and had symptoms. See additional information about travel. Do not go to places where you are unable to wear a mask, such as restaurants and some gyms, and avoid eating around others at home and at work until 10 days after the day of your positive test. If an individual has access to a test and wants to test, the best approach is to use an antigen test1 towards the end of the 5-day isolation period. If your test  result is positive, you should continue to isolate until day 10. If your test result is positive, you can also choose to test daily and if your test result is negative, you can end isolation, but continue to wear a well-fitting mask around others at home and in public until day 10. Follow additional recommendations for masking and avoiding travel as described above. 1As noted in the labeling for authorized over-the counter antigen tests: Negative results should be treated as presumptive. Negative results do not rule out SARS-CoV-2 infection and should not be used as the sole basis for treatment or patient management decisions, including infection control decisions. To improve results, antigen tests should be used twice over a three-day period with at least 24 hours and no more than 48 hours between tests. Ending isolation for people who were moderately or very sick from COVID-19 or have a weakened immune system People who are moderately ill from COVID-19 (experiencing symptoms that affect the lungs like shortness of breath or difficulty breathing) should isolate for 10 days and follow all other isolation precautions. To calculate your 10-day isolation period, day 0 is your first day of symptoms. Day 1 is the first full day after your symptoms developed. If you are unsure if your symptoms are moderate, talk to a healthcare provider for further guidance. People who are very  sick from COVID-19 (this means people who were hospitalized or required intensive care or ventilation support) and people who have weakened immune systems might need to isolate at home longer. They may also require testing with a viral test to determine when they can be around others. CDC recommends an isolation period of at least 10 and up to 20 days for people who were very sick from COVID-19 and for people with weakened immune systems. Consult with your healthcare provider about when you can resume being around other people. If you are  unsure if your symptoms are severe or if you have a weakened immune system, talk to a healthcare provider for further guidance. People who have a weakened immune system should talk to their healthcare provider about the potential for reduced immune responses to COVID-19 vaccines and the need to continue to follow current prevention measures (including wearing a well-fitting mask and avoiding crowds and poorly ventilated indoor spaces) to protect themselves against COVID-19 until advised otherwise by their healthcare provider. Close contacts of immunocompromised people--including household members--should also be encouraged to receive all recommended COVID-19 vaccine doses to help protect these people. Isolation in high-risk congregate settings In certain high-risk congregate settings that have high risk of secondary transmission and where it is not feasible to cohort people (such as Systems analyst and detention facilities, homeless shelters, and cruise ships), CDC recommends a 10-day isolation period for residents. During periods of critical staffing shortages, facilities may consider shortening the isolation period for staff to ensure continuity of operations. Decisions to shorten isolation in these settings should be made in consultation with state, local, tribal, or territorial health departments and should take into consideration the context and characteristics of the facility. CDC's setting-specific guidance provides additional recommendations for these settings. This CDC guidance is meant to supplement--not replace--any federal, state, local, territorial, or tribal health and safety laws, rules, and regulations. Recommendations for specific settings These recommendations do not apply to healthcare professionals. For guidance specific to these settings, see Healthcare professionals: Interim Guidance for Optician, dispensing with SARS-CoV-2 Infection or Exposure to SARS-CoV-2 Patients, residents,  and visitors to healthcare settings: Interim Infection Prevention and Control Recommendations for Healthcare Personnel During the [place] 2019 (COVID-19) Pandemic Additional setting-specific guidance and recommendations are available. These recommendations on quarantine and isolation do apply to [place] settings. Additional guidance is available here: Overview of COVID-19 Quarantine for K-12 Schools Travelers: Travel information and recommendations Congregate facilities and other settings: [employer] for community, work, and school settings Ongoing COVID-19 exposure FAQs I live with someone with COVID-19, but I cannot be separated from them. How do we manage quarantine in this situation? It is very important for people with COVID-19 to remain apart from other people, if possible, even if they are living together. If separation of the person with COVID-19 from others that they live with is not possible, the other people that they live with will have ongoing exposure, meaning they will be repeatedly exposed until that person is no longer able to spread the virus to other people. In this situation, there are precautions you can take to limit the spread of COVID-19: The person with COVID-19 and everyone they live with should wear a well-fitting mask inside the home. If possible, one person should care for the person with COVID-19 to limit the number of people who are in close contact with the infected person. Take steps to protect yourself and others to reduce transmission in the home: Quarantine if you are not  up to date with your COVID-19 vaccines. Isolate if you are sick or tested positive for COVID-19, even if you don't have symptoms. Learn more about the public health recommendations for testing, mask use and quarantine of close contacts, like yourself, who have ongoing exposure. These recommendations differ depending on your vaccination status. What should I do if I have ongoing  exposure to COVID-19 from someone I live with? Recommendations for this situation depend on your vaccination status: If you are not up to date on COVID-19 vaccines and have ongoing exposure to COVID-19, you should: Begin quarantine immediately and continue to quarantine throughout the isolation period of the person with COVID-19. Continue to quarantine for an additional 5 days starting the day after the end of isolation for the person with COVID-19. Get tested at least 5 days after the end of isolation of the infected person that lives with them. If you test negative, you can leave the home but should continue to wear a well-fitting mask when around others at home and in public until 10 days after the end of isolation for the person with COVID-19. Isolate immediately if you develop symptoms of COVID-19 or test positive. If you are up to date with COVID-19 vaccines and have ongoing exposure to COVID-19, you should: Get tested at least 5 days after your first exposure. A person with COVID-19 is considered infectious starting 2 days before they develop symptoms, or 2 days before the date of their positive test if they do not have symptoms. Get tested again at least 5 days after the end of isolation for the person with COVID-19. Wear a well-fitting mask when you are around the person with COVID-19, and do this throughout their isolation period. Wear a well-fitting mask around others for 10 days after the infected person's isolation period ends. Isolate immediately if you develop symptoms of COVID-19 or test positive. What should I do if multiple people I live with test positive for COVID-19 at different times? Recommendations for this situation depend on your vaccination status: If you are not up to date with your COVID-19 vaccines, you should: Quarantine throughout the isolation period of any infected person that you live with. Continue to quarantine until 5 days after the end of isolation date for  the most recently infected person that lives with you. For example, if the last day of isolation of the person most recently infected with COVID-19 was June 30, the new 5-day quarantine period starts on July 1. Get tested at least 5 days after the end of isolation for the most recently infected person that lives with you. Wear a well-fitting mask when you are around any person with COVID-19 while that person is in isolation. Wear a well-fitting mask when you are around other people until 10 days after your last close contact. Isolate immediately if you develop symptoms of COVID-19 or test positive. If you are up to date with your COVID-19 vaccines, you should: Get tested at least 5 days after your first exposure. A person with COVID-19 is considered infectious starting 2 days before they developed symptoms, or 2 days before the date of their positive test if they do not have symptoms. Get tested again at least 5 days after the end of isolation for the most recently infected person that lives with you. Wear a well-fitting mask when you are around any person with COVID-19 while that person is in isolation. Wear a well-fitting mask around others for 10 days after the end of isolation for  the most recently infected person that lives with you. For example, if the last day of isolation for the person most recently infected with COVID-19 was June 30, the new 10-day period to wear a well-fitting mask indoors in public starts on July 1. Isolate immediately if you develop symptoms of COVID-19 or test positive. I had COVID-19 and completed isolation. Do I have to quarantine or get tested if someone I live with gets COVID-19 shortly after I completed isolation? No. If you recently completed isolation and someone that lives with you tests positive for the virus that causes COVID-19 shortly after the end of your isolation period, you do not have to quarantine or get tested as long as you do not develop new symptoms.  Once all of the people that live together have completed isolation or quarantine, refer to the guidance below for new exposures to COVID-19. If you had COVID-19 in the previous 90 days and then came into close contact with someone with COVID-19, you do not have to quarantine or get tested if you do not have symptoms. But you should: Wear a well-fitting mask indoors in public for 10 days after your last close contact. Monitor for COVID-19 symptoms for 10 days from the date of your last close contact. Isolate immediately and get tested if symptoms develop. If more than 90 days have passed since your recovery from infection, follow CDC's recommendations for close contacts. These recommendations will differ depending on your vaccination status. 03/26/2021 Content source: [facility] ([facility]), Division of Viral Diseases This information is not intended to replace advice given to you by your health care provider. Make sure you discuss any questions you have with your health care provider. Document Revised: 07/30/2021 Document Reviewed: 07/30/2021 Elsevier Patient Education  2022 [employer].    If you have been instructed to have an in-person evaluation today at a local Urgent Care facility, please use the link below. It will take you to a list of all of our available [facility], including address, phone number and hours of operation. Please do not delay care.  [facility]  If you or a family member do not have a primary care provider, use the link below to schedule a visit and establish care. When you choose a [facility] primary care physician or advanced practice provider, you gain a long-term partner in health. Find a Primary Care Provider  Learn more about [facility]'s in-office and virtual care options: [facility]

## 2022-02-17 MED FILL — Escitalopram Oxalate Tab 10 MG (Base Equiv): ORAL | 90 days supply | Qty: 90 | Fill #1 | Status: AC

## 2022-02-17 MED FILL — Metoprolol Succinate Tab ER 24HR 50 MG (Tartrate Equiv): ORAL | 90 days supply | Qty: 90 | Fill #1 | Status: AC

## 2022-02-17 MED FILL — Hydrochlorothiazide Cap 12.5 MG: ORAL | 90 days supply | Qty: 90 | Fill #1 | Status: AC

## 2022-02-18 ENCOUNTER — Other Ambulatory Visit (HOSPITAL_COMMUNITY): Payer: Self-pay

## 2022-03-21 ENCOUNTER — Emergency Department (HOSPITAL_COMMUNITY): Payer: 59

## 2022-03-21 ENCOUNTER — Other Ambulatory Visit: Payer: Self-pay

## 2022-03-21 ENCOUNTER — Inpatient Hospital Stay (HOSPITAL_COMMUNITY)
Admission: EM | Admit: 2022-03-21 | Discharge: 2022-03-23 | DRG: 481 | Disposition: A | Discharge status: Home / Self Care | Payer: 59 | Attending: Internal Medicine | Admitting: Internal Medicine

## 2022-03-21 ENCOUNTER — Encounter (HOSPITAL_COMMUNITY): Payer: Self-pay | Admitting: Family Medicine

## 2022-03-21 ENCOUNTER — Inpatient Hospital Stay (HOSPITAL_COMMUNITY): Payer: 59

## 2022-03-21 DIAGNOSIS — W19XXXA Unspecified fall, initial encounter: Secondary | ICD-10-CM | POA: Diagnosis not present

## 2022-03-21 DIAGNOSIS — F909 Attention-deficit hyperactivity disorder, unspecified type: Secondary | ICD-10-CM | POA: Diagnosis not present

## 2022-03-21 DIAGNOSIS — F325 Major depressive disorder, single episode, in full remission: Secondary | ICD-10-CM

## 2022-03-21 DIAGNOSIS — Z818 Family history of other mental and behavioral disorders: Secondary | ICD-10-CM

## 2022-03-21 DIAGNOSIS — S2241XA Multiple fractures of ribs, right side, initial encounter for closed fracture: Secondary | ICD-10-CM | POA: Diagnosis not present

## 2022-03-21 DIAGNOSIS — Z20822 Contact with and (suspected) exposure to covid-19: Secondary | ICD-10-CM | POA: Diagnosis not present

## 2022-03-21 DIAGNOSIS — F329 Major depressive disorder, single episode, unspecified: Secondary | ICD-10-CM | POA: Diagnosis not present

## 2022-03-21 DIAGNOSIS — D7589 Other specified diseases of blood and blood-forming organs: Secondary | ICD-10-CM | POA: Diagnosis present

## 2022-03-21 DIAGNOSIS — S72002A Fracture of unspecified part of neck of left femur, initial encounter for closed fracture: Secondary | ICD-10-CM | POA: Diagnosis not present

## 2022-03-21 DIAGNOSIS — I10 Essential (primary) hypertension: Secondary | ICD-10-CM | POA: Diagnosis not present

## 2022-03-21 DIAGNOSIS — Z79899 Other long term (current) drug therapy: Secondary | ICD-10-CM | POA: Diagnosis not present

## 2022-03-21 DIAGNOSIS — Z87891 Personal history of nicotine dependence: Secondary | ICD-10-CM | POA: Diagnosis not present

## 2022-03-21 DIAGNOSIS — Z8249 Family history of ischemic heart disease and other diseases of the circulatory system: Secondary | ICD-10-CM

## 2022-03-21 DIAGNOSIS — S7292XA Unspecified fracture of left femur, initial encounter for closed fracture: Secondary | ICD-10-CM | POA: Diagnosis not present

## 2022-03-21 DIAGNOSIS — Y9352 Activity, horseback riding: Secondary | ICD-10-CM | POA: Diagnosis not present

## 2022-03-21 DIAGNOSIS — E079 Disorder of thyroid, unspecified: Secondary | ICD-10-CM | POA: Diagnosis present

## 2022-03-21 DIAGNOSIS — R0689 Other abnormalities of breathing: Secondary | ICD-10-CM | POA: Diagnosis not present

## 2022-03-21 DIAGNOSIS — E559 Vitamin D deficiency, unspecified: Secondary | ICD-10-CM | POA: Diagnosis present

## 2022-03-21 DIAGNOSIS — M2578 Osteophyte, vertebrae: Secondary | ICD-10-CM | POA: Diagnosis not present

## 2022-03-21 DIAGNOSIS — M5136 Other intervertebral disc degeneration, lumbar region: Secondary | ICD-10-CM | POA: Diagnosis present

## 2022-03-21 DIAGNOSIS — R0781 Pleurodynia: Secondary | ICD-10-CM | POA: Diagnosis not present

## 2022-03-21 DIAGNOSIS — S0990XA Unspecified injury of head, initial encounter: Secondary | ICD-10-CM | POA: Diagnosis not present

## 2022-03-21 DIAGNOSIS — M25552 Pain in left hip: Secondary | ICD-10-CM | POA: Diagnosis not present

## 2022-03-21 DIAGNOSIS — S72142D Displaced intertrochanteric fracture of left femur, subsequent encounter for closed fracture with routine healing: Secondary | ICD-10-CM | POA: Diagnosis not present

## 2022-03-21 DIAGNOSIS — S72142A Displaced intertrochanteric fracture of left femur, initial encounter for closed fracture: Principal | ICD-10-CM | POA: Diagnosis present

## 2022-03-21 DIAGNOSIS — R531 Weakness: Secondary | ICD-10-CM | POA: Diagnosis not present

## 2022-03-21 HISTORY — DX: Fracture of unspecified part of neck of left femur, initial encounter for closed fracture: S72.002A

## 2022-03-21 LAB — PROTIME-INR
INR: 1 (ref 0.8–1.2)
Prothrombin Time: 12.6 seconds (ref 11.4–15.2)

## 2022-03-21 LAB — CBC WITH DIFFERENTIAL/PLATELET
Abs Immature Granulocytes: 0.1 10*3/uL — ABNORMAL HIGH (ref 0.00–0.07)
Basophils Absolute: 0.1 10*3/uL (ref 0.0–0.1)
Basophils Relative: 0 %
Eosinophils Absolute: 0 10*3/uL (ref 0.0–0.5)
Eosinophils Relative: 0 %
HCT: 40.3 % (ref 36.0–46.0)
Hemoglobin: 14.3 g/dL (ref 12.0–15.0)
Immature Granulocytes: 1 %
Lymphocytes Relative: 10 %
Lymphs Abs: 1.2 10*3/uL (ref 0.7–4.0)
MCH: 36.3 pg — ABNORMAL HIGH (ref 26.0–34.0)
MCHC: 35.5 g/dL (ref 30.0–36.0)
MCV: 102.3 fL — ABNORMAL HIGH (ref 80.0–100.0)
Monocytes Absolute: 0.5 10*3/uL (ref 0.1–1.0)
Monocytes Relative: 4 %
Neutro Abs: 10.4 10*3/uL — ABNORMAL HIGH (ref 1.7–7.7)
Neutrophils Relative %: 85 %
Platelets: 298 10*3/uL (ref 150–400)
RBC: 3.94 MIL/uL (ref 3.87–5.11)
RDW: 13.1 % (ref 11.5–15.5)
WBC: 12.2 10*3/uL — ABNORMAL HIGH (ref 4.0–10.5)
nRBC: 0 % (ref 0.0–0.2)

## 2022-03-21 LAB — BASIC METABOLIC PANEL
Anion gap: 9 (ref 5–15)
BUN: 10 mg/dL (ref 6–20)
CO2: 24 mmol/L (ref 22–32)
Calcium: 8.8 mg/dL — ABNORMAL LOW (ref 8.9–10.3)
Chloride: 108 mmol/L (ref 98–111)
Creatinine, Ser: 0.73 mg/dL (ref 0.44–1.00)
GFR, Estimated: >60 mL/min (ref >60)
Glucose, Bld: 126 mg/dL — ABNORMAL HIGH (ref 70–99)
Potassium: 3.6 mmol/L (ref 3.5–5.1)
Sodium: 141 mmol/L (ref 135–145)

## 2022-03-21 LAB — RESP PANEL BY RT-PCR (FLU A&B, COVID) ARPGX2
Influenza A by PCR: NEGATIVE
Influenza B by PCR: NEGATIVE
SARS Coronavirus 2 by RT PCR: NEGATIVE

## 2022-03-21 LAB — TYPE AND SCREEN
ABO/RH(D): A POS
Antibody Screen: NEGATIVE

## 2022-03-21 MED ORDER — ONDANSETRON HCL 4 MG/2ML IJ SOLN
4.0000 mg | INTRAMUSCULAR | Status: DC | PRN
Start: 2022-03-21 — End: 2022-03-23
  Administered 2022-03-21: 4 mg via INTRAVENOUS
  Filled 2022-03-21: qty 2

## 2022-03-21 MED ORDER — ACETAMINOPHEN 325 MG PO TABS
650.0000 mg | ORAL_TABLET | ORAL | Status: DC | PRN
Start: 2022-03-21 — End: 2022-03-23

## 2022-03-21 MED ORDER — TRAZODONE HCL 50 MG PO TABS
25.0000 mg | ORAL_TABLET | Freq: Every evening | ORAL | Status: DC | PRN
Start: 2022-03-21 — End: 2022-03-23

## 2022-03-21 MED ORDER — HYDROMORPHONE HCL 1 MG/ML IJ SOLN
1.0000 mg | INTRAMUSCULAR | Status: DC | PRN
  Administered 2022-03-21 – 2022-03-22 (×2): 1 mg via INTRAVENOUS
  Filled 2022-03-21 (×2): qty 1

## 2022-03-21 MED ORDER — MORPHINE SULFATE (PF) 2 MG/ML IV SOLN
0.5000 mg | INTRAVENOUS | Status: DC | PRN
  Administered 2022-03-22 (×3): 0.5 mg via INTRAVENOUS
  Filled 2022-03-21 (×3): qty 1

## 2022-03-21 MED ORDER — MAGNESIUM HYDROXIDE 400 MG/5ML PO SUSP
30.0000 mL | Freq: Every day | ORAL | Status: DC | PRN
  Filled 2022-03-21: qty 30

## 2022-03-21 MED ORDER — MORPHINE SULFATE (PF) 4 MG/ML IV SOLN
4.0000 mg | INTRAVENOUS | Status: AC | PRN
  Administered 2022-03-21 (×2): 4 mg via INTRAVENOUS
  Filled 2022-03-21 (×2): qty 1

## 2022-03-21 MED ORDER — ONDANSETRON HCL 4 MG/2ML IJ SOLN
4.0000 mg | Freq: Once | INTRAMUSCULAR | Status: AC
  Administered 2022-03-21: 4 mg via INTRAVENOUS
  Filled 2022-03-21: qty 2

## 2022-03-21 MED ORDER — FENTANYL CITRATE PF 50 MCG/ML IJ SOSY
100.0000 ug | PREFILLED_SYRINGE | Freq: Once | INTRAMUSCULAR | Status: AC
  Administered 2022-03-21: 100 ug via INTRAVENOUS
  Filled 2022-03-21: qty 2

## 2022-03-21 MED ORDER — HYDROMORPHONE HCL 1 MG/ML IJ SOLN
1.0000 mg | Freq: Once | INTRAMUSCULAR | Status: AC
  Administered 2022-03-21: 1 mg via INTRAVENOUS
  Filled 2022-03-21: qty 1

## 2022-03-21 MED ORDER — HYDROCODONE-ACETAMINOPHEN 5-325 MG PO TABS
1.0000 | ORAL_TABLET | Freq: Four times a day (QID) | ORAL | Status: DC | PRN
  Administered 2022-03-22: 1 via ORAL
  Filled 2022-03-21: qty 2
  Filled 2022-03-21: qty 1
  Filled 2022-03-21: qty 2

## 2022-03-21 NOTE — Assessment & Plan Note (Signed)
-   The patient will be admitted to a med-surgical bed. ?- Pain management will be provided. ?- Orthopedic consultation will be obtained. ?- Dr. Susa Simmonds was notified and is aware about the patient. ?- Should be kept n.p.o. after midnight. ?

## 2022-03-21 NOTE — Assessment & Plan Note (Signed)
-   We will continue Lexapro. 

## 2022-03-21 NOTE — Assessment & Plan Note (Signed)
-   We will continue her Toprol-XL and HCTZ. ?

## 2022-03-21 NOTE — Consult Note (Signed)
Brief orthopedic consult note: ? ?51 year old female with left intertrochanteric hip fracture.  ? ?Patient is ambulatory at baseline per emergency room care provider.  Patient is indicated for cephalomedullary nail of her fracture.  I have ordered left femur films for surgical planning purposes.  EDP planning to consult hospitalist team for admission.  Please keep patient n.p.o. past midnight for surgery tomorrow.  Bedrest for now. ? ? ?

## 2022-03-21 NOTE — H&P (Signed)
?  ?  ?[place] ? ? ?[name] NAME: Alison [name]   ? ?MR#:  [id number] ? ?DATE OF BIRTH:  1971/03/04 ? ?DATE OF ADMISSION:  03/21/2022 ? ?PRIMARY CARE PHYSICIAN: Willow OraAndy, Camille L, MD  ? ?[name] is coming from: Home ? ?REQUESTING/REFERRING PHYSICIAN: Arthor CaptainHarris, Abigail, PA-C ? ?CHIEF COMPLAINT:  ? ?Chief Complaint  ?[name] presents with  ?? Hip Pain  ? ? ?HISTORY OF PRESENT ILLNESS:  ?Alison [name] is a 51 y.o. female with medical history significant for essential hypertension, ADHD and lumbar spine degenerative disc disease, coming with acute left hip pain after having a fall from her horse.  She denied any head injuries or presyncope or syncope.  No chest pain or palpitations.  She complained of right-sided rib pain.  No fever or chills.  No nausea or vomiting or abdominal pain.No recent cough or wheezing or dyspnea.  No dysuria, oliguria or hematuria or flank pain. ? ?ED Course: When she came to [facility] ER BP was 135/94 with otherwise normal vital signs.  Labs revealed CBC with of 12.2 with neutrophilia and macrocytosis.  Blood glucose 80 positive with negative antibody screen.  BMP was within normal.   ? ?EKG as reviewed by me : Ordered and is currently pending. ?Imaging: Portable chest ray showed no acute cardiopulmonary disease.  Left hip and pelvic x-ray showed displaced intertrochanteric left hip fracture. ?- Noncontrast head CT scan and C-spine CT showed [facility] following: No acute intracranial abnormalities or depressed skull fractures, sinus disease and no evidence for cervical fracture or malalignment.  It showed early degenerative features especially at C3-C4 and 2.7 cm left lower pole thyroid mass.  She is status post FNA biopsy on 06/05/2021 and had beenchanging its gross signs ?2 view left femur x-ray showed angulated comminuted intertrochanteric fractures of Alison left hip with no fracture or dislocation of Alison distal left femur.  Right rib x-ray showed right sixth and seventh rib fractures without  complicating factors. ? ?[facility] [name] was given 8 mg of IV morphine sulfate on route to [facility] hospital and here she received 100 mcg of IV fentanyl as well as 1 mg of IV Dilaudid with 4 mg of IV Zofran.  She will be admitted to a med-surgical bed for further evaluation and management. ? ?PAST MEDICAL HISTORY:  ? ?Past Medical History:  ?Diagnosis Date  ?? Adult ADHD   ?? Degeneration of intervertebral disc of lumbar region   ?? Hypertension   ? ? ?PAST SURGICAL HISTORY:  ? ?Past Surgical History:  ?Procedure Laterality Date  ?? LUMBAR DISC SURGERY  2010  ? Dr. Newell [place], [place]  ?? TONSILLECTOMY    ? ? ?SOCIAL HISTORY:  ? ?Social History  ? ?Tobacco Use  ?? Smoking status: Former  ?  Types: Cigarettes  ?? Smokeless tobacco: Never  ?? Tobacco comments:  ?  20's  ?Substance Use Topics  ?? Alcohol use: No  ? ? ?FAMILY HISTORY:  ? ?Family History  ?Problem Relation Age of Onset  ?? Stroke Mother   ?? Heart disease Mother   ?? Hypertension Mother   ?? Goiter Mother   ?? Sudden Cardiac Death Mother   ?? Lymphoma Father   ?? COPD Father   ?? Hypertension Father   ?? Heart disease Father   ?     STENTS PLACE  ?? Atrial fibrillation Father   ?     POST DIALYSIS  ?? Hypertension Sister   ?? Fibroids Sister   ?? Heart disease Maternal Uncle   ?  HAD CABG  ?? Anxiety disorder Sister   ?? Healthy Daughter   ? ? ?DRUG ALLERGIES:  ?No Known Allergies ? ?REVIEW OF SYSTEMS:  ? ?ROS ?As per history of present illness. All pertinent systems were reviewed above. Constitutional, HEENT, cardiovascular, respiratory, GI, GU, musculoskeletal, neuro, psychiatric, endocrine, integumentary and hematologic systems were reviewed and are otherwise negative/unremarkable except for positive findings mentioned above in [facility] HPI. ? ? ?MEDICATIONS AT HOME:  ? ?Prior to Admission medications   ?Medication Sig Start Date End Date Taking? Authorizing Provider  ?amphetamine-dextroamphetamine (ADDERALL XR) 20 MG 24 hr capsule Take 1 capsule (20 mg total) by  mouth daily. 06/13/21   Willow Ora, MD  ?amphetamine-dextroamphetamine (ADDERALL XR) 20 MG 24 hr capsule Take 1 capsule (20 mg total) by mouth daily. 07/13/21   Willow Ora, MD  ?amphetamine-dextroamphetamine (ADDERALL XR) 20 MG 24 hr capsule Take 1 capsule (20 mg total) by mouth daily. 11/26/21   Willow Ora, MD  ?escitalopram (LEXAPRO) 10 MG tablet TAKE 1 TABLET BY MOUTH ONCE A DAY 11/12/21 11/12/22  Willow Ora, MD  ?fluticasone (FLONASE) 50 MCG/ACT nasal spray Place 1 spray into both nostrils daily. 05/14/21   Willow Ora, MD  ?hydrochlorothiazide (MICROZIDE) 12.5 MG capsule TAKE 1 CAPSULE BY MOUTH ONCE A DAY 11/12/21 11/12/22  Willow Ora, MD  ?levonorgestrel (MIRENA, 52 MG,) 20 MCG/24HR IUD 20 Intra Uterine Devices by Intrauterine route daily. 08/06/15   [provider]  ?metoprolol succinate (TOPROL-XL) 50 MG 24 hr tablet TAKE 1 TABLET BY MOUTH ONCE A DAY 11/12/21 11/12/22  Willow Ora, MD  ? ?  ? ?VITAL SIGNS:  ?Blood pressure (!) 140/94, pulse 86, temperature 98.2 ?F (36.8 ?C), temperature source Oral, resp. rate 18, SpO2 94 %. ? ?PHYSICAL EXAMINATION:  ?Physical Exam ? ?GENERAL:  51 y.o.-year-old female  [name] lying in [facility] bed with no acute distress.  ?EYES: Pupils equal, round, reactive to light and accommodation. No scleral icterus. Extraocular muscles intact.  ?HEENT: Head atraumatic, normocephalic. Oropharynx and nasopharynx clear.  ?NECK:  Supple, no jugular venous distention. No thyroid enlargement, no tenderness.  ?LUNGS: Normal breath sounds bilaterally, no wheezing, rales,rhonchi or crepitation. No use of accessory muscles of respiration.  ?CARDIOVASCULAR: Regular rate and rhythm, S1, S2 normal. No murmurs, rubs, or gallops.  ?ABDOMEN: Soft, nondistended, nontender. Bowel sounds present. No organomegaly or mass.  ?EXTREMITIES: No pedal edema, cyanosis, or clubbing. ?Musculoskeletal: Left lateral hip tenderness. ?NEUROLOGIC: Cranial nerves II through XII are  intact. Muscle strength 5/5 in all extremities. Sensation intact. Gait not checked.  ?PSYCHIATRIC: [facility] [name] is alert and oriented x 3.  Normal affect and good eye contact. ?SKIN: No obvious rash, lesion, or ulcer.  ? ?LABORATORY PANEL:  ? ?CBC ?Recent Labs  ?Lab 03/21/22 ?1920  ?WBC 12.2*  ?HGB 14.3  ?HCT 40.3  ?PLT 298  ? ?------------------------------------------------------------------------------------------------------------------ ? ?Chemistries  ?Recent Labs  ?Lab 03/21/22 ?1920  ?NA 141  ?K 3.6  ?CL 108  ?CO2 24  ?GLUCOSE 126*  ?BUN 10  ?CREATININE 0.73  ?CALCIUM 8.8*  ? ?------------------------------------------------------------------------------------------------------------------ ? ?Cardiac Enzymes ?No results for input(s): TROPONINI in Al[facility] last 168 hours. ?------------------------------------------------------------------------------------------------------------------ ? ?RADIOLOGY:  ?DG Ribs Unilateral W/Chest Right ? ?Result Date: 03/21/2022 ?CLINICAL DATA:  Fall from horse with right-sided rib pain, initial encounter EXAM: RIGHT RIBS AND CHEST - 3+ VIEW COMPARISON:  None. FINDINGS: Right lung is well aerated. Mildly displaced fractures of [facility] right sixth and seventh ribs are seen posterolaterally. No pneumothorax  is noted. No other bony abnormality is seen. IMPRESSION: Right sixth and seventh rib fractures without complicating factors. Electronically Signed   By: Alcide Clever M.D.   On: 03/21/2022 21:13  ? ?CT Head Wo Contrast ? ?Result Date: 03/21/2022 ?CLINICAL DATA:  Head and neck trauma.  Fell from a horse today. EXAM: CT HEAD WITHOUT CONTRAST CT CERVICAL SPINE WITHOUT CONTRAST TECHNIQUE: Multidetector CT imaging of [facility] head and cervical spine was performed following [facility] standard protocol without intravenous contrast. Multiplanar CT image reconstructions of [facility] cervical spine were also generated. RADIATION DOSE REDUCTION: This exam was performed according to [facility] departmental dose-optimization  program which includes automated exposure control, adjustment of the mA and/or kV according to [name] size and/or use of iterative reconstruction technique. COMPARISON:  None. FINDINGS: CT HEAD FINDINGS Brain: No eviden

## 2022-03-21 NOTE — ED Provider Notes (Signed)
?[facility] EMERGENCY DEPARTMENT ?Provider Note ? ? ?CSN: [id number] ?Arrival date & time: 03/21/22  1755 ? ?  ? ?History ? ?Chief Complaint  ?Patient presents with  ? Hip Pain  ? ? ?Al[facility] [name] is a 51 y.o. female past medical history of hypertension who presents emergency department for evaluation of injury after being thrown off a horse.  Patient's daughter is at bedside and supplements the history.  She was riding the horse.  Daughter states the horse went 1 way and her body went the other way.  She landed on her left hip.  It was immediately deformed and she was unable to get up.  They called 911 and she was transported here.  She denies any numbness or tingling.  She has severe pain in her hip with internal rotation and deformity at baseline.  She did hit her head and was not wearing a helmet but denies loss of consciousness and is not on blood thinners.  I have placed the patient in a c-collar for distracting injury.  She is also complaining of pain in the right lower rib cage. ? ? ?Hip Pain ? ? ?  ? ?Home Medications ?Prior to Admission medications   ?Medication Sig Start Date End Date Taking? Authorizing Provider  ?amphetamine-dextroamphetamine (ADDERALL XR) 20 MG 24 hr capsule Take 1 capsule (20 mg total) by mouth daily. 06/13/21   Willow Ora, MD  ?amphetamine-dextroamphetamine (ADDERALL XR) 20 MG 24 hr capsule Take 1 capsule (20 mg total) by mouth daily. 07/13/21   Willow Ora, MD  ?amphetamine-dextroamphetamine (ADDERALL XR) 20 MG 24 hr capsule Take 1 capsule (20 mg total) by mouth daily. 11/26/21   Willow Ora, MD  ?escitalopram (LEXAPRO) 10 MG tablet TAKE 1 TABLET BY MOUTH ONCE A DAY 11/12/21 11/12/22  Willow Ora, MD  ?fluticasone (FLONASE) 50 MCG/ACT nasal spray Place 1 spray into both nostrils daily. 05/14/21   Willow Ora, MD  ?hydrochlorothiazide (MICROZIDE) 12.5 MG capsule TAKE 1 CAPSULE BY MOUTH ONCE A DAY 11/12/21 11/12/22  Willow Ora, MD   ?levonorgestrel (MIRENA, 52 MG,) 20 MCG/24HR IUD 20 Intra Uterine Devices by Intrauterine route daily. 08/06/15   [provider]  ?metoprolol succinate (TOPROL-XL) 50 MG 24 hr tablet TAKE 1 TABLET BY MOUTH ONCE A DAY 11/12/21 11/12/22  Willow Ora, MD  ?   ? ?Allergies    ?Patient has no known allergies.   ? ?Review of Systems   ?Review of Systems ?Physical Exam ?Updated Vital Signs ?BP (!) 122/94   Pulse 93   Temp 98.6 ?F (37 ?C) (Oral)   Resp 19   SpO2 94%  ?Physical Exam ?Vitals and nursing note reviewed.  ?Constitutional:   ?   General: She is not in acute distress. ?   Appearance: She is well-developed. She is not diaphoretic.  ?HENT:  ?   Head: Normocephalic and atraumatic.  ?   Right Ear: External ear normal.  ?   Left Ear: External ear normal.  ?   Nose: Nose normal.  ?   Mouth/Throat:  ?   Mouth: Mucous membranes are moist.  ?Eyes:  ?   General: No scleral icterus. ?   Conjunctiva/sclera: Conjunctivae normal.  ?Cardiovascular:  ?   Rate and Rhythm: Normal rate and regular rhythm.  ?   Heart sounds: Normal heart sounds. No murmur heard. ?  No friction rub. No gallop.  ?Pulmonary:  ?   Effort: Pulmonary effort is normal.  No respiratory distress.  ?   Breath sounds: Normal breath sounds.  ?Abdominal:  ?   General: Bowel sounds are normal. There is no distension.  ?   Palpations: Abdomen is soft. There is no mass.  ?   Tenderness: There is no abdominal tenderness. There is no guarding.  ?Musculoskeletal:     ?   General: Deformity present.  ?   Comments: Deformity of the left lower extremity at the hip with internal rotation, no significant shortening.  She has strong distal pulses able to wiggle toes and normal sensation. ?Point tender of the right lower lobe rib cage, no obvious crepitus or deformities.  ?Skin: ?   General: Skin is warm and dry.  ?Neurological:  ?   Mental Status: She is alert and oriented to person, place, and time.  ?Psychiatric:     ?   Behavior: Behavior normal.  ? ? ?ED  Results / Procedures / Treatments   ?Labs ?(all labs ordered are listed, but only abnormal results are displayed) ?Labs Reviewed  ?BASIC METABOLIC PANEL - Abnormal; Notable for the following components:  ?    Result Value  ? Glucose, Bld 126 (*)   ? Calcium 8.8 (*)   ? All other components within normal limits  ?CBC WITH DIFFERENTIAL/PLATELET - Abnormal; Notable for the following components:  ? WBC 12.2 (*)   ? MCV 102.3 (*)   ? MCH 36.3 (*)   ? Neutro Abs 10.4 (*)   ? Abs Immature Granulocytes 0.10 (*)   ? All other components within normal limits  ?RESP PANEL BY RT-PCR (FLU A&B, COVID) [id number]  ?PROTIME-INR  ?TYPE AND SCREEN  ?ABO/RH  ? ? ?EKG ?None ? ?Radiology ?CT Head Wo Contrast ? ?Result Date: 03/21/2022 ?CLINICAL DATA:  Head and neck trauma.  Fell from a horse today. EXAM: CT HEAD WITHOUT CONTRAST CT CERVICAL SPINE WITHOUT CONTRAST TECHNIQUE: Multidetector CT imaging of the head and cervical spine was performed following the standard protocol without intravenous contrast. Multiplanar CT image reconstructions of the cervical spine were also generated. RADIATION DOSE REDUCTION: This exam was performed according to the departmental dose-optimization program which includes automated exposure control, adjustment of the mA and/or kV according to patient size and/or use of iterative reconstruction technique. COMPARISON:  None. FINDINGS: CT HEAD FINDINGS Brain: No evidence of acute infarction, hemorrhage, hydrocephalus, extra-axial collection or mass lesion/mass effect. There are dystrophic benign dural calcifications in the frontal and superior falx. Vascular: No hyperdense vessel or unexpected calcification. Skull: The calvarium, skull base and orbits are intact without focal calvarial lesions. There is no visible scalp hematoma. Sinuses/Orbits: There is mild membrane thickening and small retention cysts in the maxillary and sphenoid sinuses, mild membrane thickening in the ethmoids. The frontal sinus and mastoid  air cells are clear. Unremarkable orbital contents. Other: None. CT CERVICAL SPINE FINDINGS Alignment: Normal. Skull base and vertebrae: The bone mineralization within normal limits. No fracture is evident. The vertebra are normal in heights. No focal osteolytic lesion is seen or sclerotic process. Soft tissues and spinal canal: No prevertebral fluid or swelling. No visible canal hematoma. There is a 2.7 cm mass of the inferior pole left lobe of the thyroid gland, which underwent FNA biopsy 06/05/2021 with unknown results. Gross size is unchanged. Please correlate with pathologic results. No cervical adenopathy is seen. Parotid glands are fatty replaced but symmetric. Disc levels: There is preservation of the normal cervical disc heights. There are small anterior endplate spurs C3-4 through C6-7, at  all of these levels with mild posterior disc osteophyte complex formation eccentric to the left causing mild effacement of the ventral CSF without mass effect on the cord. Narrowing and spurring is noted of the anterior atlantodental joint. There are no herniated discs or appreciable cord compromise. Mild uncinate joint and facet spurring is seen with foraminal stenosis which is moderate on the left at C3-4, without further cervical foraminal compromise. Upper chest: There is no apical pneumothorax. Other: None. IMPRESSION: 1. No acute intracranial CT findings or depressed skull fractures. 2. Sinus disease. 3. No evidence of cervical fractures or malalignment. 4. Early degenerative features. 5. 2.7 cm left lower pole thyroid mass, with FNA biopsy performed 06/05/2021 with unknown results. Correlate with pathologic results. There is no change in the gross size. Electronically Signed   By: Almira BarKeith  Chesser M.D.   On: 03/21/2022 20:16  ? ?CT Cervical Spine Wo Contrast ? ?Result Date: 03/21/2022 ?CLINICAL DATA:  Head and neck trauma.  Fell from a horse today. EXAM: CT HEAD WITHOUT CONTRAST CT CERVICAL SPINE WITHOUT CONTRAST  TECHNIQUE: Multidetector CT imaging of the head and cervical spine was performed following the standard protocol without intravenous contrast. Multiplanar CT image reconstructions of the cervical spine were also generate

## 2022-03-21 NOTE — Assessment & Plan Note (Addendum)
She has been managing without Aderall XR for a while. ?

## 2022-03-21 NOTE — ED Triage Notes (Signed)
Pt here via EMS from home d/t left hip pain. Pt fell from her horse. No LOC reported. Reports left hip pain. Pedal pulses noted. Pt able to move toes. Pt alert and oriented X4.  ? ?18g LAC ?Morphine give enroute 8mg  ?

## 2022-03-21 NOTE — Assessment & Plan Note (Signed)
-   This is right sixth and seventh traumatic uncomplicated rib fractures secondary to her fall. ?- Pain management will be provided. ?

## 2022-03-22 ENCOUNTER — Other Ambulatory Visit: Payer: Self-pay

## 2022-03-22 ENCOUNTER — Encounter (HOSPITAL_COMMUNITY): Payer: Self-pay | Admitting: Family Medicine

## 2022-03-22 ENCOUNTER — Encounter (HOSPITAL_COMMUNITY)
Admission: EM | Disposition: A | Discharge status: Home / Self Care | Payer: Self-pay | Source: Home / Self Care | Attending: Internal Medicine

## 2022-03-22 ENCOUNTER — Inpatient Hospital Stay (HOSPITAL_COMMUNITY): Payer: 59

## 2022-03-22 ENCOUNTER — Inpatient Hospital Stay (HOSPITAL_COMMUNITY): Payer: 59 | Admitting: Anesthesiology

## 2022-03-22 DIAGNOSIS — S72142A Displaced intertrochanteric fracture of left femur, initial encounter for closed fracture: Secondary | ICD-10-CM

## 2022-03-22 HISTORY — PX: INTRAMEDULLARY (IM) NAIL INTERTROCHANTERIC: SHX5875

## 2022-03-22 LAB — BASIC METABOLIC PANEL
Anion gap: 8 (ref 5–15)
BUN: 9 mg/dL (ref 6–20)
CO2: 25 mmol/L (ref 22–32)
Calcium: 9.1 mg/dL (ref 8.9–10.3)
Chloride: 104 mmol/L (ref 98–111)
Creatinine, Ser: 0.7 mg/dL (ref 0.44–1.00)
GFR, Estimated: >60 mL/min (ref >60)
Glucose, Bld: 122 mg/dL — ABNORMAL HIGH (ref 70–99)
Potassium: 3.6 mmol/L (ref 3.5–5.1)
Sodium: 137 mmol/L (ref 135–145)

## 2022-03-22 LAB — CBC
HCT: 40.2 % (ref 36.0–46.0)
Hemoglobin: 13.9 g/dL (ref 12.0–15.0)
MCH: 35.8 pg — ABNORMAL HIGH (ref 26.0–34.0)
MCHC: 34.6 g/dL (ref 30.0–36.0)
MCV: 103.6 fL — ABNORMAL HIGH (ref 80.0–100.0)
Platelets: 300 10*3/uL (ref 150–400)
RBC: 3.88 MIL/uL (ref 3.87–5.11)
RDW: 13.2 % (ref 11.5–15.5)
WBC: 9.2 10*3/uL (ref 4.0–10.5)
nRBC: 0 % (ref 0.0–0.2)

## 2022-03-22 LAB — MRSA NEXT GEN BY PCR, NASAL: MRSA by PCR Next Gen: NOT DETECTED

## 2022-03-22 LAB — ABO/RH: ABO/RH(D): A POS

## 2022-03-22 LAB — HIV ANTIBODY (ROUTINE TESTING W REFLEX): HIV Screen 4th Generation wRfx: NONREACTIVE

## 2022-03-22 SURGERY — FIXATION, FRACTURE, INTERTROCHANTERIC, WITH INTRAMEDULLARY ROD
Anesthesia: General | Laterality: Left

## 2022-03-22 MED ORDER — LIDOCAINE 2% (20 MG/ML) 5 ML SYRINGE
INTRAMUSCULAR | Status: DC | PRN
  Administered 2022-03-22: 40 mg via INTRAVENOUS

## 2022-03-22 MED ORDER — ROCURONIUM BROMIDE 10 MG/ML (PF) SYRINGE
PREFILLED_SYRINGE | INTRAVENOUS | Status: AC
  Filled 2022-03-22: qty 20

## 2022-03-22 MED ORDER — SUGAMMADEX SODIUM 200 MG/2ML IV SOLN
INTRAVENOUS | Status: DC | PRN
  Administered 2022-03-22: 200 mg via INTRAVENOUS

## 2022-03-22 MED ORDER — ONDANSETRON HCL 4 MG/2ML IJ SOLN
INTRAMUSCULAR | Status: AC
  Filled 2022-03-22: qty 2

## 2022-03-22 MED ORDER — DOCUSATE SODIUM 100 MG PO CAPS
100.0000 mg | ORAL_CAPSULE | Freq: Two times a day (BID) | ORAL | Status: DC
  Administered 2022-03-22 – 2022-03-23 (×3): 100 mg via ORAL
  Filled 2022-03-22 (×3): qty 1

## 2022-03-22 MED ORDER — DEXAMETHASONE SODIUM PHOSPHATE 10 MG/ML IJ SOLN
INTRAMUSCULAR | Status: AC
  Filled 2022-03-22: qty 1

## 2022-03-22 MED ORDER — ACETAMINOPHEN 500 MG PO TABS
500.0000 mg | ORAL_TABLET | Freq: Four times a day (QID) | ORAL | Status: AC
  Filled 2022-03-22 (×2): qty 1

## 2022-03-22 MED ORDER — PROPOFOL 10 MG/ML IV BOLUS
INTRAVENOUS | Status: AC
  Filled 2022-03-22: qty 20

## 2022-03-22 MED ORDER — PROPOFOL 10 MG/ML IV BOLUS
INTRAVENOUS | Status: DC | PRN
  Administered 2022-03-22: 140 mg via INTRAVENOUS

## 2022-03-22 MED ORDER — LACTATED RINGERS IV SOLN: INTRAVENOUS | Status: DC

## 2022-03-22 MED ORDER — METOCLOPRAMIDE HCL 5 MG/ML IJ SOLN: 5.0000 mg | Freq: Three times a day (TID) | INTRAMUSCULAR | Status: DC | PRN

## 2022-03-22 MED ORDER — NAPROXEN 250 MG PO TABS
250.0000 mg | ORAL_TABLET | Freq: Two times a day (BID) | ORAL | Status: DC
  Administered 2022-03-22 – 2022-03-23 (×2): 250 mg via ORAL
  Filled 2022-03-22 (×2): qty 1

## 2022-03-22 MED ORDER — MIDAZOLAM HCL 2 MG/2ML IJ SOLN
INTRAMUSCULAR | Status: DC | PRN
  Administered 2022-03-22: 2 mg via INTRAVENOUS

## 2022-03-22 MED ORDER — ACETAMINOPHEN 10 MG/ML IV SOLN
1000.0000 mg | Freq: Once | INTRAVENOUS | Status: DC | PRN
  Administered 2022-03-22: 1000 mg via INTRAVENOUS

## 2022-03-22 MED ORDER — SCOPOLAMINE 1 MG/3DAYS TD PT72
MEDICATED_PATCH | TRANSDERMAL | Status: DC | PRN
  Administered 2022-03-22: 1 via TRANSDERMAL

## 2022-03-22 MED ORDER — METOPROLOL TARTRATE 5 MG/5ML IV SOLN
2.5000 mg | Freq: Four times a day (QID) | INTRAVENOUS | Status: DC | PRN
  Administered 2022-03-22: 2.5 mg via INTRAVENOUS
  Filled 2022-03-22: qty 5

## 2022-03-22 MED ORDER — AMISULPRIDE (ANTIEMETIC) 5 MG/2ML IV SOLN: 10.0000 mg | Freq: Once | INTRAVENOUS | Status: DC | PRN

## 2022-03-22 MED ORDER — CHLORHEXIDINE GLUCONATE 0.12 % MT SOLN: 15.0000 mL | Freq: Once | OROMUCOSAL | Status: AC

## 2022-03-22 MED ORDER — CEFAZOLIN SODIUM-DEXTROSE 2-4 GM/100ML-% IV SOLN
2.0000 g | INTRAVENOUS | Status: AC
  Administered 2022-03-22: 2 g via INTRAVENOUS
  Filled 2022-03-22: qty 100

## 2022-03-22 MED ORDER — CHLORHEXIDINE GLUCONATE 4 % EX LIQD
60.0000 mL | Freq: Once | CUTANEOUS | Status: DC
  Filled 2022-03-22: qty 60

## 2022-03-22 MED ORDER — CEFAZOLIN SODIUM-DEXTROSE 1-4 GM/50ML-% IV SOLN
1.0000 g | Freq: Four times a day (QID) | INTRAVENOUS | Status: AC
  Administered 2022-03-22 – 2022-03-23 (×3): 1 g via INTRAVENOUS
  Filled 2022-03-22 (×4): qty 50

## 2022-03-22 MED ORDER — ONDANSETRON HCL 4 MG/2ML IJ SOLN
INTRAMUSCULAR | Status: DC | PRN
  Administered 2022-03-22: 4 mg via INTRAVENOUS

## 2022-03-22 MED ORDER — HYDROMORPHONE HCL 1 MG/ML IJ SOLN
0.2500 mg | INTRAMUSCULAR | Status: DC | PRN
  Administered 2022-03-22: 0.5 mg via INTRAVENOUS
  Administered 2022-03-22 (×2): 0.25 mg via INTRAVENOUS

## 2022-03-22 MED ORDER — HYDROMORPHONE HCL 1 MG/ML IJ SOLN
INTRAMUSCULAR | Status: AC
  Filled 2022-03-22: qty 1

## 2022-03-22 MED ORDER — ENOXAPARIN SODIUM 40 MG/0.4ML IJ SOSY
40.0000 mg | PREFILLED_SYRINGE | INTRAMUSCULAR | Status: DC
  Administered 2022-03-23: 40 mg via SUBCUTANEOUS
  Filled 2022-03-22: qty 0.4

## 2022-03-22 MED ORDER — METOCLOPRAMIDE HCL 5 MG PO TABS: 5.0000 mg | ORAL_TABLET | Freq: Three times a day (TID) | ORAL | Status: DC | PRN

## 2022-03-22 MED ORDER — FENTANYL CITRATE (PF) 250 MCG/5ML IJ SOLN
INTRAMUSCULAR | Status: DC | PRN
  Administered 2022-03-22: 50 ug via INTRAVENOUS
  Administered 2022-03-22: 100 ug via INTRAVENOUS

## 2022-03-22 MED ORDER — DEXAMETHASONE SODIUM PHOSPHATE 10 MG/ML IJ SOLN
INTRAMUSCULAR | Status: DC | PRN
  Administered 2022-03-22: 10 mg via INTRAVENOUS

## 2022-03-22 MED ORDER — ACETAMINOPHEN 10 MG/ML IV SOLN
INTRAVENOUS | Status: AC
  Filled 2022-03-22: qty 100

## 2022-03-22 MED ORDER — ACETAMINOPHEN 160 MG/5ML PO SOLN: 325.0000 mg | Freq: Once | ORAL | Status: DC | PRN

## 2022-03-22 MED ORDER — ROCURONIUM BROMIDE 10 MG/ML (PF) SYRINGE
PREFILLED_SYRINGE | INTRAVENOUS | Status: DC | PRN
  Administered 2022-03-22: 20 mg via INTRAVENOUS
  Administered 2022-03-22: 50 mg via INTRAVENOUS

## 2022-03-22 MED ORDER — MIDAZOLAM HCL 2 MG/2ML IJ SOLN
INTRAMUSCULAR | Status: AC
  Filled 2022-03-22: qty 2

## 2022-03-22 MED ORDER — METHOCARBAMOL 500 MG PO TABS
500.0000 mg | ORAL_TABLET | Freq: Four times a day (QID) | ORAL | Status: DC | PRN
  Administered 2022-03-22 – 2022-03-23 (×4): 500 mg via ORAL
  Filled 2022-03-22 (×4): qty 1

## 2022-03-22 MED ORDER — 0.9 % SODIUM CHLORIDE (POUR BTL) OPTIME
TOPICAL | Status: DC | PRN
  Administered 2022-03-22: 1000 mL

## 2022-03-22 MED ORDER — DIPHENHYDRAMINE HCL 12.5 MG/5ML PO ELIX: 12.5000 mg | ORAL_SOLUTION | ORAL | Status: DC | PRN

## 2022-03-22 MED ORDER — SCOPOLAMINE 1 MG/3DAYS TD PT72
MEDICATED_PATCH | TRANSDERMAL | Status: AC
  Filled 2022-03-22: qty 1

## 2022-03-22 MED ORDER — HYDROCODONE-ACETAMINOPHEN 5-325 MG PO TABS
1.0000 | ORAL_TABLET | ORAL | Status: DC | PRN
  Administered 2022-03-22: 2 via ORAL
  Administered 2022-03-22 (×2): 1 via ORAL
  Administered 2022-03-23 (×2): 2 via ORAL
  Filled 2022-03-22: qty 1
  Filled 2022-03-22: qty 2
  Filled 2022-03-22: qty 1
  Filled 2022-03-22: qty 2

## 2022-03-22 MED ORDER — EPHEDRINE 5 MG/ML INJ
INTRAVENOUS | Status: AC
  Filled 2022-03-22: qty 5

## 2022-03-22 MED ORDER — CHLORHEXIDINE GLUCONATE 0.12 % MT SOLN
OROMUCOSAL | Status: AC
  Administered 2022-03-22: 15 mL via OROMUCOSAL
  Filled 2022-03-22: qty 15

## 2022-03-22 MED ORDER — POVIDONE-IODINE 10 % EX SWAB
2.0000 "application " | Freq: Once | CUTANEOUS | Status: AC
  Administered 2022-03-22: 2 via TOPICAL

## 2022-03-22 MED ORDER — LIDOCAINE 2% (20 MG/ML) 5 ML SYRINGE
INTRAMUSCULAR | Status: AC
  Filled 2022-03-22: qty 5

## 2022-03-22 MED ORDER — ORAL CARE MOUTH RINSE: 15.0000 mL | Freq: Once | OROMUCOSAL | Status: AC

## 2022-03-22 MED ORDER — ACETAMINOPHEN 325 MG PO TABS: 325.0000 mg | ORAL_TABLET | Freq: Once | ORAL | Status: DC | PRN

## 2022-03-22 MED ORDER — METHOCARBAMOL 1000 MG/10ML IJ SOLN
500.0000 mg | Freq: Four times a day (QID) | INTRAVENOUS | Status: DC | PRN
  Filled 2022-03-22: qty 5

## 2022-03-22 MED ORDER — FENTANYL CITRATE (PF) 250 MCG/5ML IJ SOLN
INTRAMUSCULAR | Status: AC
  Filled 2022-03-22: qty 5

## 2022-03-22 MED ORDER — MEPERIDINE HCL 25 MG/ML IJ SOLN: 6.2500 mg | INTRAMUSCULAR | Status: DC | PRN

## 2022-03-22 SURGICAL SUPPLY — 56 items
BAG COUNTER SPONGE SURGICOUNT (BAG) ×2 IMPLANT
BIT DRILL 4.0X280 (BIT) ×1 IMPLANT
BNDG COHESIVE 4X5 TAN STRL (GAUZE/BANDAGES/DRESSINGS) IMPLANT
BNDG COHESIVE 6X5 TAN STRL LF (GAUZE/BANDAGES/DRESSINGS) ×4 IMPLANT
BNDG ELASTIC 6X10 VLCR STRL LF (GAUZE/BANDAGES/DRESSINGS) ×1 IMPLANT
BRUSH SCRUB EZ PLAIN DRY (MISCELLANEOUS) ×3 IMPLANT
CHLORAPREP W/TINT 26 (MISCELLANEOUS) ×2 IMPLANT
COVER MAYO STAND STRL (DRAPES) ×2 IMPLANT
COVER PERINEAL POST (MISCELLANEOUS) ×2 IMPLANT
COVER SURGICAL LIGHT HANDLE (MISCELLANEOUS) ×2 IMPLANT
DRAPE C-ARM 35X43 STRL (DRAPES) ×2 IMPLANT
DRAPE C-ARMOR (DRAPES) ×2 IMPLANT
DRAPE HALF SHEET 40X57 (DRAPES) ×4 IMPLANT
DRAPE INCISE IOBAN 66X45 STRL (DRAPES) ×2 IMPLANT
DRAPE STERI IOBAN 125X83 (DRAPES) ×2 IMPLANT
DRAPE SURG 17X23 STRL (DRAPES) ×2 IMPLANT
DRAPE U-SHAPE 47X51 STRL (DRAPES) ×2 IMPLANT
DRESSING MEPILEX FLEX 4X4 (GAUZE/BANDAGES/DRESSINGS) IMPLANT
DRSG MEPILEX BORDER 4X4 (GAUZE/BANDAGES/DRESSINGS) ×5 IMPLANT
DRSG MEPILEX BORDER 4X8 (GAUZE/BANDAGES/DRESSINGS) ×3 IMPLANT
DRSG MEPILEX FLEX 4X4 (GAUZE/BANDAGES/DRESSINGS) ×4
ELECT REM PT RETURN 9FT ADLT (ELECTROSURGICAL) ×2
ELECTRODE REM PT RTRN 9FT ADLT (ELECTROSURGICAL) ×1 IMPLANT
GLOVE SRG 8 PF TXTR STRL LF DI (GLOVE) ×1 IMPLANT
GLOVE SURG ENC TEXT LTX SZ7.5 (GLOVE) ×2 IMPLANT
GLOVE SURG UNDER POLY LF SZ8 (GLOVE) ×2
GOWN STRL REUS W/ TWL LRG LVL3 (GOWN DISPOSABLE) ×1 IMPLANT
GOWN STRL REUS W/TWL LRG LVL3 (GOWN DISPOSABLE) ×2
GUIDE PIN ARTH 3.2X330 (PIN) ×2
GUIDEWIRE BALL NOSE 3.0X900 (WIRE) ×4
GUIDEWIRE ORTH 900X3XBALL NOSE (WIRE) IMPLANT
KIT BASIN OR (CUSTOM PROCEDURE TRAY) ×2 IMPLANT
KIT TURNOVER KIT B (KITS) ×2 IMPLANT
MANIFOLD NEPTUNE II (INSTRUMENTS) ×2 IMPLANT
NAIL 9X130X20 SHORT (Nail) ×1 IMPLANT
NS IRRIG 1000ML POUR BTL (IV SOLUTION) ×2 IMPLANT
PACK GENERAL/GYN (CUSTOM PROCEDURE TRAY) ×2 IMPLANT
PAD ARMBOARD 7.5X6 YLW CONV (MISCELLANEOUS) ×4 IMPLANT
PIN GUIDE ARTH 3.2X330 (PIN) IMPLANT
SCREW LAG GALILEO FEM 10.5X100 (Screw) ×1 IMPLANT
SCREW LOCK CORT 5X36 (Screw) ×1 IMPLANT
STAPLER VISISTAT 35W (STAPLE) ×2 IMPLANT
SUT MNCRL AB 3-0 PS2 27 (SUTURE) ×1 IMPLANT
SUT MON AB 2-0 CT1 36 (SUTURE) ×2 IMPLANT
SUT MON AB 3-0 SH 27 (SUTURE) ×2
SUT MON AB 3-0 SH27 (SUTURE) ×1 IMPLANT
SUT PDS AB 2-0 CT1 27 (SUTURE) ×2 IMPLANT
SUT VIC AB 2-0 CT1 27 (SUTURE) ×4
SUT VIC AB 2-0 CT1 TAPERPNT 27 (SUTURE) IMPLANT
TOOL ACTIVATION (INSTRUMENTS) ×1 IMPLANT
TOWEL GREEN STERILE (TOWEL DISPOSABLE) ×4 IMPLANT
TOWEL GREEN STERILE FF (TOWEL DISPOSABLE) ×2 IMPLANT
TRAY FOLEY W/BAG SLVR 16FR (SET/KITS/TRAYS/PACK) ×2
TRAY FOLEY W/BAG SLVR 16FR ST (SET/KITS/TRAYS/PACK) IMPLANT
UNDERPAD 30X36 HEAVY ABSORB (UNDERPADS AND DIAPERS) ×2 IMPLANT
WATER STERILE IRR 1000ML POUR (IV SOLUTION) ×2 IMPLANT

## 2022-03-22 NOTE — Transfer of Care (Signed)
Immediate Anesthesia Transfer of Care Note ? ?Patient: Alison [name] ? ?Procedure(s) Performed: INTRAMEDULLARY (IM) NAIL, HIP (Left) ? ?Patient Location: PACU ? ?Anesthesia Type:General ? ?Level of Consciousness: drowsy ? ?Airway & Oxygen Therapy: Patient Spontanous Breathing and Patient connected to face mask oxygen ? ?Post-op Assessment: Report given to RN and Post -op Vital signs reviewed and stable ? ?Post vital signs: Reviewed and stable ? ?Last Vitals:  ?Vitals Value Taken Time  ?BP 144/99 03/22/22 1132  ?Temp    ?Pulse 84 03/22/22 1133  ?Resp 12 03/22/22 1133  ?SpO2 95 % 03/22/22 1133  ?Vitals shown include unvalidated device data. ? ?Last Pain:  ?Vitals:  ? 03/22/22 0802  ?TempSrc:   ?PainSc: 10-Worst pain ever  ?   ? ?  ? ?Complications: No notable events documented. ?

## 2022-03-22 NOTE — Consult Note (Signed)
Reason for Consult: Left intertrochanteric hip fracture ?Referring Physician: [facility] emergency department ? ?Alison [name] is an 51 y.o. female.  ?HPI: Larey Seat from a horse yesterday and had pain in her left hip.  She was brought to the emergency department where x-rays revealed a left comminuted displaced intertrochanteric hip fracture.  Patient denied pain elsewhere.  She was admitted to the hospitalist team.  She denies numbness or tingling.  She has a history of hypertension. ? ?Past Medical History:  ?Diagnosis Date  ? Adult ADHD   ? Degeneration of intervertebral disc of lumbar region   ? Hypertension   ? ? ?Past Surgical History:  ?Procedure Laterality Date  ? LUMBAR DISC SURGERY  2010  ? Dr. Newell [place]  ? TONSILLECTOMY    ? ? ?Family History  ?Problem Relation Age of Onset  ? Stroke Mother   ? Heart disease Mother   ? Hypertension Mother   ? Goiter Mother   ? Sudden Cardiac Death Mother   ? Lymphoma Father   ? COPD Father   ? Hypertension Father   ? Heart disease Father   ?     STENTS PLACE  ? Atrial fibrillation Father   ?     POST DIALYSIS  ? Hypertension Sister   ? Fibroids Sister   ? Heart disease Maternal Uncle   ?     HAD CABG  ? Anxiety disorder Sister   ? Healthy Daughter   ? ? ?Social History:  reports that she has quit smoking. Her smoking use included cigarettes. She has never used smokeless tobacco. She reports that she does not drink alcohol and does not use drugs. ? ?Allergies: No Known Allergies ? ?Medications: I have reviewed the patient's current medications. ? ?Results for orders placed or performed during the hospital encounter of 03/21/22 (from the past 48 hour(s))  ?Basic metabolic panel     Status: Abnormal  ? Collection Time: 03/21/22  7:20 PM  ?Result Value Ref Range  ? Sodium 141 135 - 145 mmol/L  ? Potassium 3.6 3.5 - 5.1 mmol/L  ? Chloride 108 98 - 111 mmol/L  ? CO2 24 22 - 32 mmol/L  ? Glucose, Bld 126 (H) 70 - 99 mg/dL  ?  Comment: Glucose reference range applies only to  samples taken after fasting for at least 8 hours.  ? BUN 10 6 - 20 mg/dL  ? Creatinine, Ser 0.73 0.44 - 1.00 mg/dL  ? Calcium 8.8 (L) 8.9 - 10.3 mg/dL  ? GFR, Estimated >60 >60 mL/min  ?  Comment: (NOTE) ?Calculated using the CKD-EPI Creatinine Equation (2021) ?  ? Anion gap 9 5 - 15  ?  Comment: Performed at [facility], [address], [place], [place] [address]  ?CBC WITH DIFFERENTIAL     Status: Abnormal  ? Collection Time: 03/21/22  7:20 PM  ?Result Value Ref Range  ? WBC 12.2 (H) 4.0 - 10.5 K/uL  ? RBC 3.94 3.87 - 5.11 MIL/uL  ? Hemoglobin 14.3 12.0 - 15.0 g/dL  ? HCT 40.3 36.0 - 46.0 %  ? MCV 102.3 (H) 80.0 - 100.0 fL  ? MCH 36.3 (H) 26.0 - 34.0 pg  ? MCHC 35.5 30.0 - 36.0 g/dL  ? RDW 13.1 11.5 - 15.5 %  ? Platelets 298 150 - 400 K/uL  ? nRBC 0.0 0.0 - 0.2 %  ? Neutrophils Relative % 85 %  ? Neutro Abs 10.4 (H) 1.7 - 7.7 K/uL  ?  Lymphocytes Relative 10 %  ? Lymphs Abs 1.2 0.7 - 4.0 K/uL  ? Monocytes Relative 4 %  ? Monocytes Absolute 0.5 0.1 - 1.0 K/uL  ? Eosinophils Relative 0 %  ? Eosinophils Absolute 0.0 0.0 - 0.5 K/uL  ? Basophils Relative 0 %  ? Basophils Absolute 0.1 0.0 - 0.1 K/uL  ? Immature Granulocytes 1 %  ? Abs Immature Granulocytes 0.10 (H) 0.00 - 0.07 K/uL  ?  Comment: Performed at [facility], [address] [place], [place] [address]  ?Protime-INR     Status: None  ? Collection Time: 03/21/22  7:20 PM  ?Result Value Ref Range  ? Prothrombin Time 12.6 11.4 - 15.2 seconds  ? INR 1.0 0.8 - 1.2  ?  Comment: (NOTE) ?INR goal varies based on device and disease states. ?Performed at [facility], [address] [place], [place] ?[address] ?  ?Type and screen  [facility]     Status: None  ? Collection Time: 03/21/22  7:20 PM  ?Result Value Ref Range  ? ABO/RH(D) A POS   ? Antibody Screen NEG   ? Sample Expiration    ?  03/24/2022,2359 ?Performed at [facility], [address] [place], [place] [address] ?  ?Resp Panel by RT-PCR (Flu A&B, Covid)  Nasopharyngeal Swab     Status: None  ? Collection Time: 03/21/22  7:44 PM  ? Specimen: Nasopharyngeal Swab; Nasopharyngeal(NP) swabs in vial transport medium  ?Result Value Ref Range  ? SARS Coronavirus 2 by RT PCR NEGATIVE NEGATIVE  ?  Comment: (NOTE) ?SARS-CoV-2 target nucleic acids are NOT DETECTED. ? ?The SARS-CoV-2 RNA is generally detectable in upper respiratory ?specimens during the acute phase of infection. The lowest ?concentration of SARS-CoV-2 viral copies this assay can detect is ?138 copies/mL. A negative result does not preclude SARS-Cov-2 ?infection and should not be used as the sole basis for treatment or ?other patient management decisions. A negative result may occur with  ?improper specimen collection/handling, submission of specimen other ?than nasopharyngeal swab, presence of viral mutation(s) within the ?areas targeted by this assay, and inadequate number of viral ?copies(<138 copies/mL). A negative result must be combined with ?clinical observations, patient history, and epidemiological ?information. The expected result is Negative. ? ?Fact Sheet for Patients:  ?BloggerCourse.comhttps://www.fda.gov/media/152166/download ? ?Fact Sheet for Healthcare Providers:  ?SeriousBroker.ithttps://www.fda.gov/media/152162/download ? ?This test is no t yet approved or cleared by the [place] FDA and  ?has been authorized for detection and/or diagnosis of SARS-CoV-2 by ?FDA under an Emergency Use Authorization (EUA). This EUA will remain  ?in effect (meaning this test can be used) for the duration of the ?COVID-19 declaration under Section 564(b)(1) of the Act, 21 ?U.S.C.section 360bbb-3(b)(1), unless the authorization is terminated  ?or revoked sooner.  ? ? ?  ? Influenza A by PCR NEGATIVE NEGATIVE  ? Influenza B by PCR NEGATIVE NEGATIVE  ?  Comment: (NOTE) ?The Xpert Xpress SARS-CoV-2/FLU/RSV plus assay is intended as an aid ?in the diagnosis of influenza from Nasopharyngeal swab specimens and ?should not be used as a sole basis for  treatment. Nasal washings and ?aspirates are unacceptable for Xpert Xpress SARS-CoV-2/FLU/RSV ?testing. ? ?Fact Sheet for Patients: ?BloggerCourse.comhttps://www.fda.gov/media/152166/download ? ?Fact Sheet for Healthcare Providers: ?SeriousBroker.ithttps://www.fda.gov/media/152162/download ? ?This test is not yet approved or cleared by the [place] FDA and ?has been authorized for detection and/or diagnosis of SARS-CoV-2 by ?FDA under an Emergency Use Authorization (EUA). This EUA will remain ?in effect (meaning this  test can be used) for the duration of the ?COVID-19 declaration under Section 564(b)(1) of the Act, 21 U.S.C. ?section 360bbb-3(b)(1), unless the authorization is terminated or ?revoked. ? ?Performed at [facility], [address], [place], [place] ?[address] ?  ?MRSA Next Gen by PCR, Nasal     Status: None  ? Collection Time: 03/22/22  1:42 AM  ? Specimen: Nasal Mucosa; Nasal Swab  ?Result Value Ref Range  ? MRSA by PCR Next Gen NOT DETECTED NOT DETECTED  ?  Comment: (NOTE) ?The GeneXpert MRSA Assay (FDA approved for NASAL specimens only), ?is one component of a comprehensive MRSA colonization surveillance ?program. It is not intended to diagnose MRSA infection nor to guide ?or monitor treatment for MRSA infections. ?Test performance is not FDA approved in patients less than 2 years ?old. ?Performed at [facility], [address], [place], [place] ?[address] ?  ?ABO/Rh     Status: None  ? Collection Time: 03/22/22  3:27 AM  ?Result Value Ref Range  ? ABO/RH(D)    ?  A POS ?Performed at [facility], [address] [place], [place] [address] ?  ?CBC     Status: Abnormal  ? Collection Time: 03/22/22  3:27 AM  ?Result Value Ref Range  ? WBC 9.2 4.0 - 10.5 K/uL  ? RBC 3.88 3.87 - 5.11 MIL/uL  ? Hemoglobin 13.9 12.0 - 15.0 g/dL  ? HCT 40.2 36.0 - 46.0 %  ? MCV 103.6 (H) 80.0 - 100.0 fL  ? MCH 35.8 (H) 26.0 - 34.0 pg  ? MCHC 34.6 30.0 - 36.0 g/dL  ? RDW 13.2 11.5 - 15.5 %  ? Platelets 300 150 - 400 K/uL  ? nRBC 0.0  0.0 - 0.2 %  ?  Comment: Performed at [facility], [address], [place], [place] [address]  ?Basic metabolic panel     Status: Abnormal  ? Collection Time: 03/22/22  3:27 AM  ?Result Value Ref Range  ?

## 2022-03-22 NOTE — Anesthesia Procedure Notes (Signed)
Procedure Name: Intubation ?Date/Time: 03/22/2022 9:05 AM ?Performed by: Carolan Clines, CRNA ?Pre-anesthesia Checklist: Patient identified, Emergency Drugs available, Suction available and Patient being monitored ?Patient Re-evaluated:Patient Re-evaluated prior to induction ?Oxygen Delivery Method: Circle System Utilized ?Preoxygenation: Pre-oxygenation with 100% oxygen ?Induction Type: IV induction ?Ventilation: Mask ventilation without difficulty and Oral airway inserted - appropriate to patient size ?Laryngoscope Size: Mac and 3 ?Grade View: Grade II ?Tube type: Oral ?Number of attempts: 1 ?Airway Equipment and Method: Oral airway and Bougie stylet ?Placement Confirmation: ETT inserted through vocal cords under direct vision, positive ETCO2 and breath sounds checked- equal and bilateral ?Secured at: 22 cm ?Tube secured with: Tape ?Dental Injury: Teeth and Oropharynx as per pre-operative assessment  ?Comments: DL with Mac 3, grade IIb view-anterior airway. Successful intubation with bougie stylet. ? ? ? ? ?

## 2022-03-22 NOTE — Progress Notes (Signed)
?PROGRESS NOTE ? ?Alison [name]  ?DOB: September 09, 1971  ?PCP: Leamon Arnt, MD ?LX:[id number]  ?DOA: 03/21/2022 ? LOS: 1 day  ?Hospital Day: 2 ? ?Brief narrative: ?Alison [name] is a 51 y.o. female with PMH significant for essential hypertension, ADHD and lumbar spine degenerative disc disease. ?Patient presented to the ED from home after she had a fall from her horse sustaining acute left hip pain.  ?Hemodynamically stable in the ED ?Left hip and pelvic x-ray showed displaced intertrochanteric left hip fracture. ?Chest x-ray showed right sixth and seventh rib fractures ?CT head and CT C-spine did not show any acute intracranial abnormality or cervical fracture/malalignment.  ? ?Patient was given IV pain meds, IV antiemetics. ?Admitted to hospitalist service. ? ?Subjective: ?Patient was seen and examined this afternoon.  Pain control postsurgically. ?Remains hemodynamically stable ?Labs unremarkable ? ?Principal Problem: ?  Closed left hip fracture ([place]) ?Active Problems: ?  Multiple fractures of ribs, right side, initial encounter for closed fracture ?  Essential hypertension ?  Attention deficit hyperactivity disorder ?  Major depression ?  ? ?Assessment and Plan: ?Closed left hip fracture ?-Secondary to mechanical fall ?-Orthopedic consultation obtained with Dr. Lucia Gaskins ?-Underwent ORIF today. ?-Pain management and DVT prophylaxis per orthopedics. ? ?Multiple fractures of ribs, right side,  ?-Right sixth and seventh ribs on complicated traumatic fracture  ?-Pain management, incentive spirometry, breathing exercises, lidocaine patch ? ?Essential hypertension ?-Continue Toprol.  Keep HCTZ on hold prior to surgery.   ?-Continue to monitor blood pressure ? ?Attention deficit hyperactivity disorder ?-Patient states she has been managing without Aderall XR for a while. ? ?Major depression ?-continue Lexapro. ? ?Thyroid mass ?-CT scan showed 2.7 cm left lower pole thyroid mass.  She is status post FNA biopsy on  06/05/2021.  On chart review, I noted that it was reported as follicular lesion of undetermined at significance.  No gross change in size and current scan. ? ?Goals of care ?  Code Status: Full Code  ? ? ?Mobility: Pending PT eval ? ?Nutritional status:  ?Body mass index is 27.44 kg/m?.  ?  ?  ? ? ? ? ?Diet:  ?Diet Order   ? ?       ?  Diet NPO time specified  Diet effective now       ?  ? ?  ?  ? ?  ? ? ?DVT prophylaxis:  ?SCDs Start: 03/21/22 2047 ?  ?Antimicrobials:  periop Ancef ?Fluid: None ?Consultants: Orthopedics ?Family Communication: None at bedside ? ?Status is: Inpatient ? ?Continue in-hospital care because: POD 0 ?Level of care: Med-Surg  ? ?Dispo: The patient is from: Home ?             Anticipated d/c is to: Pending PT eval ?             Patient currently is not medically stable to d/c. ?  Difficult to place patient No ? ? ? ? ?Infusions:  ?  ceFAZolin (ANCEF) IV    ? lactated ringers    ? ? ?Scheduled Meds: ? chlorhexidine  60 mL Topical Once  ? chlorhexidine  15 mL Mouth/Throat Once  ? Or  ? mouth rinse  15 mL Mouth Rinse Once  ? povidone-iodine  2 application. Topical Once  ? ? ?PRN meds: ?[MAR Hold] acetaminophen, [MAR Hold] HYDROcodone-acetaminophen, [MAR Hold]  HYDROmorphone (DILAUDID) injection, [MAR Hold] magnesium hydroxide, [MAR Hold] metoprolol tartrate, [MAR Hold]  morphine injection, [MAR Hold] ondansetron (ZOFRAN) IV, [MAR Hold] traZODone  ? ?  Antimicrobials: ?Anti-infectives (From admission, onward)  ? ? Start     Dose/Rate Route Frequency Ordered Stop  ? 03/22/22 0700  ceFAZolin (ANCEF) IVPB 2g/100 mL premix       ? 2 g ?200 mL/hr over 30 Minutes Intravenous On call to O.R. 03/22/22 0612 03/23/22 0559  ? ?  ? ? ?Objective: ?Vitals:  ? 03/22/22 0429 03/22/22 0734  ?BP: (!) 147/94 (!) 153/95  ?Pulse: 67 83  ?Resp:  16  ?Temp: 98 ?F (36.7 ?C) 98.3 ?F (36.8 ?C)  ?SpO2: 99% 97%  ? ?No intake or output data in the 24 hours ending 03/22/22 0809 ?Filed Weights  ? 03/21/22 2250  ?Weight: 74.8 kg   ? ?Weight change:  ?Body mass index is 27.44 kg/m?.  ? ?Physical Exam: ?General exam: Pleasant, middle-aged Caucasian female.  Pain controlled ?Skin: No rashes, lesions or ulcers. ?HEENT: Atraumatic, normocephalic, no obvious bleeding ?Lungs: Clear to auscultation bilaterally ?CVS: Regular rate and rhythm, no murmur ?GI/Abd soft, nontender, nondistended, bowel sound present ?CNS: Alert, awake, oriented x3 ?Psychiatry: Mood appropriate ?Extremities: No pedal edema, no calf tenderness ? ?Data Review: I have personally reviewed the laboratory data and studies available. ? ?F/u labs ordered ?Unresulted Labs (From admission, onward)  ? ?  Start     Ordered  ? 03/21/22 2047  HIV Antibody (routine testing w rflx)  (HIV Antibody (Routine testing w reflex) panel)  Once,   R       ? 03/21/22 2049  ? ?  ?  ? ?  ? ? ?Signed, ?Terrilee Croak, MD ?[facility] ?03/22/2022 ? ? ? ? ? ? ? ? ? ? ? ? ?

## 2022-03-22 NOTE — Final Progress Note (Signed)
Pt unable to give urine sample for pregnancy test. Pt assures there is no way she can be pregnant. Dr Hollis made aware. No new orders received ?

## 2022-03-22 NOTE — Anesthesia Postprocedure Evaluation (Signed)
Anesthesia Post Note ? ?Patient: Alison [name] ? ?Procedure(s) Performed: INTRAMEDULLARY (IM) NAIL, HIP (Left) ? ?  ? ?Patient location during evaluation: PACU ?Anesthesia Type: General ?Level of consciousness: awake and alert ?Pain management: pain level controlled ?Vital Signs Assessment: post-procedure vital signs reviewed and stable ?Respiratory status: spontaneous breathing, nonlabored ventilation, respiratory function stable and patient connected to nasal cannula oxygen ?Cardiovascular status: blood pressure returned to baseline and stable ?Postop Assessment: no apparent nausea or vomiting ?Anesthetic complications: no ? ? ?No notable events documented. ? ?Last Vitals:  ?Vitals:  ? 03/22/22 1202 03/22/22 1217  ?BP: (!) 156/84 (!) 162/86  ?Pulse: 77 74  ?Resp: 13 15  ?Temp:  36.7 ?C  ?SpO2: 97% 100%  ?  ?Last Pain:  ?Vitals:  ? 03/22/22 1202  ?TempSrc:   ?PainSc: 5   ? ? ?  ?  ?  ?  ?  ?  ? ?Shelton Silvas ? ? ? ? ?

## 2022-03-22 NOTE — Anesthesia Preprocedure Evaluation (Addendum)
Anesthesia Evaluation  ?Patient identified by MRN, date of birth, ID band ?Patient awake ? ? ? ?Reviewed: ?Allergy & Precautions, NPO status , Patient's Chart, lab work & pertinent test results ? ?Airway ?Mallampati: II ? ?TM Distance: <3 FB ?Neck ROM: Full ? ? ? Dental ? ?(+) Teeth Intact, Dental Advisory Given ?  ?Pulmonary ?former smoker,  ?  ?breath sounds clear to auscultation ? ? ? ? ? ? Cardiovascular ?hypertension,  ?Rhythm:Regular Rate:Normal ? ? ?  ?Neuro/Psych ?PSYCHIATRIC DISORDERS Depression   ? GI/Hepatic ?negative GI ROS, Neg liver ROS,   ?Endo/Other  ?negative endocrine ROS ? Renal/GU ?negative Renal ROS  ? ?  ?Musculoskeletal ? ?(+) Arthritis ,  ? Abdominal ?Normal abdominal exam  (+)   ?Peds ? Hematology ?negative hematology ROS ?(+)   ?Anesthesia Other Findings ? ? Reproductive/Obstetrics ? ?  ? ? ? ? ? ? ? ? ? ? ? ? ? ?  ?  ? ? ? ? ? ? ? ?Anesthesia Physical ?Anesthesia Plan ? ?ASA: 2 ? ?Anesthesia Plan: General  ? ?Post-op Pain Management:   ? ?Induction: Intravenous ? ?PONV Risk Score and Plan: 4 or greater and Ondansetron, Dexamethasone, Midazolam and Scopolamine patch - Pre-op ? ?Airway Management Planned: Oral ETT ? ?Additional Equipment: None ? ?Intra-op Plan:  ? ?Post-operative Plan: Extubation in OR ? ?Informed Consent: I have reviewed the patients History and Physical, chart, labs and discussed the procedure including the risks, benefits and alternatives for the proposed anesthesia with the patient or authorized representative who has indicated his/her understanding and acceptance.  ? ? ? ? ? ?Plan Discussed with: CRNA ? ?Anesthesia Plan Comments:   ? ? ? ? ? ?Anesthesia Quick Evaluation ? ?

## 2022-03-22 NOTE — Op Note (Signed)
?Alison [name] ?female ?51 y.o. ?03/22/2022 ? ?PreOperative Diagnosis: ?Left intertochanteric hip fracture ? ?PostOperative Diagnosis: ?same ? ?PROCEDURE: ?Cephalomedullary nail of left hip fracture ? ?SURGEON: Dub Mikes, MD ? ?ASSISTANT: Jesse [place], PA-C.  His assistance was necessary for prep, positioning, drape, obtaining and maintaining provisional fixation and implant placement.  He was also helpful for closure and dressing placement.  ? ?ANESTHESIA: General endotracheal tube anesthesia ? ?FINDINGS: ?Displaced intertrochanteric hip fracture ? ?IMPLANTS: ?Arthrex short intertrochanteric fixation nail ? ?INDICATIONS:50 y.o. female had a fall from a horse and sustained a intertrochanteric hip fracture.  She was indicated for surgery due to baseline ambulatory status and significant pain due to the fracture.  Discussion was had with the patient about the surgery and they wish to proceed.  Patient understood the risks, benefits and alternatives to surgery which include but are not limited to wound healing complications, infection, nonunion, malunion, need for further surgery as well as damage to surrounding structures. They also understood the potential for continued pain in that there were no guarantees of acceptable outcome After weighing these risks the patient opted to proceed with surgery. ? ?PROCEDURE: ?Patient was identified in the preoperative holding area and the left hip was marked by myself.  The consent was signed by myself and the patient.  This identified the left lower extremity as the appropriate operative extremity and the surgeries to be performed was operative treatment of the hip fracture. They  were taken to the operating room where general endotracheal anesthesia was induced without difficulty and then was moved supine on a Hana table.  The operative leg was placed into the traction boot.  The non-operative leg was well-padded and affixed to the right leg holder with coban.  The  arm was crossed over the chest and well padded. 2 g of Ancef was given.  Surgical timeout was performed.   ? ?Using fluoroscopy appropriate AP and lateral x-rays of the hip were obtained.  Then using traction through the Hana table attempt was made to obtain adequate reduction.  Acceptable reduction was performed in order to perform prepping and draping of the extremity.  Acceptable reduction was confirmed on AP and lateral x-rays.  Then the left hip was prepped and draped in usual sterile fashion.  A shower curtain drape was placed.  We then reassessed our reduction radiographically.  There was some rotational component due to the nature of the fracture.  We were unable to gain acceptable reduction due to interposed comminution.  A 4 cm incision was made at the fracture site on the lateral aspect of the thigh.  This was taken sharply down through skin and subcutaneous tissue.  Then the IT band was identified and incised in line with the incision.  Were able to gain indirect access to the fracture site.  There is significant rotational component and comminuted depressed fragments that were impeding adequate reduction.  The leg was externally rotated in comparison to the proximal fracture site and it was acceptably reduced then.  The traction was removed to allow for seating of the fracture fragments.  Acceptable reduction was confirmed.  Attempt was made to clamp the fracture but it did not change the overall fracture alignment.  We then began hardware placement by placing the guidepin percutaneously at the appropriate starting point at the tip of the greater trochanter.  The appropriate starting point was confirmed on AP and lateral radiograph.  Then a 3 cm incision was made about the site of the percutaneous placement  of the pin and the opening reamer with a soft tissue guide was placed into the wound down to the tip of the greater trochanter and the bone was entered with the opening reamer down to the level of  lesser trochanter.  Then the openning reamer and guide pin were removed and a nail was placed. Then we chose an 9 mm cephalo-medullary nail.  This was placed without difficulty and appropriate positioning was confirmed on fluoroscopy.  Then using the blade insertion guide a second incision was made distally down the thigh to allow for placement of the cephalo-medullary screw.  The guidepin for the blade was placed up the femoral neck in the appropriate position center center in the femoral head.  Then the reamer was used to ream for the blade and the blade was placed without difficulty.  The length of the cephalomedullary screw was 100 mm.  The screw was allowed to compress.  Then the distal interlock screw was placed without difficulty.  Final films were taken.  The wounds were irrigated with normal saline and the incisions were closed in a layered fashion using 2-0 Vicryl, 2-0 monocryl and staples. ? ?Mepilex dressings were used.  Patient was then transferred to the hospital bed and awakened from anesthesia without difficulty.  Counts were correct at the end the case.  There were no complications. ? ?POST OPERATIVE INSTRUCTIONS: ?WBAT operative extremity ?Patient will be discharged on 325 mg aspirin for DVT prophylaxis if not already on DVT prophylaxis at baseline ?Follow-up in 2 weeks for x-rays of the operative hip and suture removal if appropriate ?Discharge per hospitalist team and physical therapy ? ? ? ?BLOOD LOSS:  200 mL ?        ?DRAINS: none ?        ?SPECIMEN: none ?      ?COMPLICATIONS:  * No complications entered in OR log * ?        ?Disposition: PACU - hemodynamically stable. ?        ?Condition: stable   ?

## 2022-03-23 ENCOUNTER — Other Ambulatory Visit (HOSPITAL_COMMUNITY): Payer: Self-pay

## 2022-03-23 LAB — VITAMIN B12: Vitamin B-12: 107 pg/mL — ABNORMAL LOW (ref 180–914)

## 2022-03-23 LAB — FOLATE: Folate: 7.3 ng/mL (ref >5.9)

## 2022-03-23 LAB — VITAMIN D 25 HYDROXY (VIT D DEFICIENCY, FRACTURES): Vit D, 25-Hydroxy: 18.72 ng/mL — ABNORMAL LOW (ref 30–100)

## 2022-03-23 MED ORDER — ASPIRIN 325 MG PO TABS: 325.0000 mg | ORAL_TABLET | Freq: Every day | ORAL | 0 refills | Status: AC

## 2022-03-23 MED ORDER — CYANOCOBALAMIN 1000 MCG PO TABS
1000.0000 ug | ORAL_TABLET | Freq: Every day | ORAL | 0 refills | Status: DC
  Filled 2022-03-23: qty 90, 90d supply, fill #0

## 2022-03-23 MED ORDER — METHOCARBAMOL 500 MG PO TABS: 500.0000 mg | ORAL_TABLET | Freq: Four times a day (QID) | ORAL | 0 refills | Status: DC | PRN

## 2022-03-23 MED ORDER — HYDROCODONE-ACETAMINOPHEN 5-325 MG PO TABS
1.0000 | ORAL_TABLET | ORAL | 0 refills | Status: AC | PRN
  Filled 2022-03-23 (×3): qty 30, 5d supply, fill #0

## 2022-03-23 MED ORDER — LIDOCAINE 4 % EX PTCH: 1.0000 | MEDICATED_PATCH | Freq: Every day | CUTANEOUS | 0 refills | Status: DC | PRN

## 2022-03-23 MED ORDER — LIDOCAINE 5 % EX PTCH
1.0000 | MEDICATED_PATCH | CUTANEOUS | Status: DC
  Administered 2022-03-23: 1 via TRANSDERMAL
  Filled 2022-03-23: qty 1

## 2022-03-23 MED ORDER — METHOCARBAMOL 500 MG PO TABS
500.0000 mg | ORAL_TABLET | Freq: Four times a day (QID) | ORAL | 0 refills | Status: DC | PRN
  Filled 2022-03-23: qty 30, 8d supply, fill #0

## 2022-03-23 MED ORDER — VITAMIN D (ERGOCALCIFEROL) 1.25 MG (50000 UNIT) PO CAPS
50000.0000 [IU] | ORAL_CAPSULE | ORAL | 0 refills | Status: AC
  Filled 2022-03-23: qty 8, 56d supply, fill #0

## 2022-03-23 MED ORDER — VITAMIN B-12 1000 MCG PO TABS: 1000.0000 ug | ORAL_TABLET | Freq: Every day | ORAL | Status: DC

## 2022-03-23 MED ORDER — DOCUSATE SODIUM 100 MG PO CAPS: 100.0000 mg | ORAL_CAPSULE | Freq: Two times a day (BID) | ORAL | 0 refills | Status: DC

## 2022-03-23 MED ORDER — DOCUSATE SODIUM 100 MG PO CAPS
100.0000 mg | ORAL_CAPSULE | Freq: Two times a day (BID) | ORAL | 0 refills | Status: DC
  Filled 2022-03-23: qty 10, 5d supply, fill #0

## 2022-03-23 MED ORDER — CYANOCOBALAMIN 1000 MCG/ML IJ SOLN
1000.0000 ug | Freq: Once | INTRAMUSCULAR | Status: AC
  Administered 2022-03-23: 1000 ug via INTRAMUSCULAR
  Filled 2022-03-23: qty 1

## 2022-03-23 MED ORDER — LIDOCAINE 4 % EX PTCH
1.0000 | MEDICATED_PATCH | Freq: Every day | CUTANEOUS | 0 refills | Status: AC | PRN
  Filled 2022-03-23: qty 30, 30d supply, fill #0

## 2022-03-23 MED ORDER — HYDROCODONE-ACETAMINOPHEN 5-325 MG PO TABS: 1.0000 | ORAL_TABLET | ORAL | 0 refills | Status: DC | PRN

## 2022-03-23 NOTE — TOC Transition Note (Signed)
Transition of Care (TOC) - CM/SW Discharge Note ? ? ?Patient Details  ?Name: Alison [name] ?MRN: [id number] ?Date of Birth: 25-Apr-1971 ? ?Transition of Care (TOC) CM/SW Contact:  ?Bess Kinds, RN ?Phone Number: [phone] ?03/23/2022, 4:51 PM ? ? ?Clinical Narrative:    ? ?Notified by PT at 11:58 via secure chat that patient needed RW and 3/1, but no HH needed. Referral to [facility] for delivery to the room.  ? ?Final next level of care: Home/Self Care ?Barriers to Discharge: No Barriers Identified ? ? ?Patient Goals and CMS Choice ?  ?  ?  ? ?Discharge Placement ?  ?           ?  ?  ?  ?  ? ?Discharge Plan and Services ?  ?  ?           ?DME Arranged: 3-N-1, Walker rolling ?DME Agency: [facility] ?Date DME Agency Contacted: 03/23/22 ?Time DME Agency Contacted: 1200 ?Representative spoke with at DME Agency: Leavy Cella ?HH Arranged: NA ?HH Agency: NA ?  ?  ?  ? ?Social Determinants of Health (SDOH) Interventions ?  ? ? ?Readmission Risk Interventions ?   ? View : No data to display.  ?  ?  ?  ? ? ? ? ? ?

## 2022-03-23 NOTE — Progress Notes (Signed)
? ? ? ?  Alison [name] is a 51 y.o. female  ? ?Orthopaedic diagnosis: Left intertrochanteric hip fracture ? ?Surgery: Cephalomedullary nail of left hip fracture, 03/22/2022 ? ?Subjective: ?Patient appears comfortable in bed.  She says her pain is well controlled.  She states she has sat up in bed, but no formal ambulation yet.  She is looking forward to working with physical therapy today.  Foley was removed this morning.  No bowel movement yet.  Overall feels well.  She would like to be discharged home, and does have a daughter who is local who will be able to stay with her. No other questions or concerns. ? ?Objectyive: ?Vitals:  ? 03/23/22 0110 03/23/22 0531  ?BP: 132/86 127/82  ?Pulse: 76 70  ?Resp: 16 14  ?Temp: 97.9 ?F (36.6 ?C) 98.2 ?F (36.8 ?C)  ?SpO2: 96% 91%  ?  ? ?Exam: ?Awake and alert ?Respirations even and unlabored ?No acute distress ? ?Examination of the left hip shows intact, clean, and dry surgical dressings.  Swelling appropriate.  Tolerates very gentle active flexion and extension of the hip and knee in a limited fashion.  Intact range of motion at the ankle and palpable +2 pedal pulses.  Sensation is grossly intact left lower extremity.  Calf is soft and nontender.  Toes are warm and well-perfused distally. ? ?Assessment: ?Postop day 1, status post the above, suitable for discharge from an orthopedic standpoint once cleared by physical therapy and medicine team ? ? ?Plan: ?-Weightbearing as tolerated right lower extremity with use of walker ?-Out of bed with PT/OT today ?-Hydrocodone for pain control, Robaxin for spasms.  Both sent to outpatient pharmacy. ?-Lovenox for DVT prophylaxis while inpatient, discharge with full-strength aspirin x 1 month ?-Follow-up with Dr. Nicki Guadalajara [facility] [phone]) 2 weeks from surgery for staple removal if appropriate, x-rays, and likely discussion of outpatient physical therapy. ? ? ?Marjie Chea J. [place], PA-C ? ? ?

## 2022-03-23 NOTE — Evaluation (Signed)
Physical Therapy Evaluation  ?Patient Details ?Name: Alison [name] ?MRN: [id number] ?DOB: May 11, 1971 ?Today's Date: 03/23/2022 ? ?History of Present Illness ? Pt is a 51 y/o F presenting to ED on 3/25 after falling off horse. Workup showing L intertrochanteric hip fx and L rib fractures. s/p IM nail on 3/26. PMH includes DJD of lumbar region, HTN, and ADHD ?  ?Clinical Impression ? This patient presents with acute pain and decreased functional independence following the above mentioned procedure. At the time of PT eval, pt was able to perform transfers and ambulation with gross supervision for safety and RW for support. Pt was educated on HEP and handout provided, as well as car transfer and appropriate activity progression. This patient is appropriate for skilled PT interventions to address functional limitations, improve safety and independence with functional mobility, and return to PLOF.    ?   ? ?Recommendations for follow up therapy are one component of a multi-disciplinary discharge planning process, led by the attending physician.  Recommendations may be updated based on patient status, additional functional criteria and insurance authorization. ? ?Follow Up Recommendations No PT follow up ? ?  ?Assistance Recommended at Discharge Intermittent Supervision/Assistance  ?Patient can return home with the following ? A little help with walking and/or transfers;Assistance with cooking/housework;Assist for transportation;Help with stairs or ramp for entrance ? ?  ?Equipment Recommendations Rolling walker (2 wheels);BSC/3in1  ?Recommendations for Other Services ?    ?  ?Functional Status Assessment Patient has had a recent decline in their functional status and demonstrates the ability to make significant improvements in function in a reasonable and predictable amount of time.  ? ?  ?Precautions / Restrictions Precautions ?Precautions: Fall ?Restrictions ?Weight Bearing Restrictions: Yes ?LLE Weight Bearing: Weight  bearing as tolerated  ? ?  ? ?Mobility ? Bed Mobility ?  ?  ?  ?  ?  ?  ?  ?General bed mobility comments: Pt was received sitting up in the recliner. ?  ? ?Transfers ?Overall transfer level: Needs assistance ?Equipment used: Rolling walker (2 wheels) ?Transfers: Sit to/from Stand ?Sit to Stand: Supervision ?  ?  ?  ?  ?  ?General transfer comment: Close supervision as pt powered up to full stand. Increased time due to pain. VC's for kicking LLE out before stand>sit due to pain. ?  ? ?Ambulation/Gait ?Ambulation/Gait assistance: Supervision ?Gait Distance (Feet): 125 Feet ?Assistive device: Rolling walker (2 wheels) ?Gait Pattern/deviations: Step-to pattern, Step-through pattern, Decreased stride length, Decreased weight shift to left, Decreased dorsiflexion - left, Decreased stance time - left, Trunk flexed ?Gait velocity: Decreased ?Gait velocity interpretation: <1.31 ft/sec, indicative of household ambulator ?  ?General Gait Details: VC's for improved step/stride length, increased heel strike, and fluidity of walker movement. Pt able to make corrective changes, however unable to maintain. ? ?Stairs ?  ?  ?  ?  ?  ? ?Wheelchair Mobility ?  ? ?Modified Rankin (Stroke Patients Only) ?  ? ?  ? ?Balance Overall balance assessment: Mild deficits observed, not formally tested ?  ?  ?  ?  ?  ?  ?  ?  ?  ?  ?  ?  ?  ?  ?  ?  ?  ?  ?   ? ? ? ?Pertinent Vitals/Pain Pain Assessment ?Pain Assessment: Faces ?Faces Pain Scale: Hurts little more ?Pain Location: LLE and ribs with mobility ?Pain Descriptors / Indicators: Aching, Constant, Discomfort ?Pain Intervention(s): Limited activity within patient's tolerance, Monitored during session, Repositioned  ? ? ?  Home Living Family/patient expects to be discharged to:: Private residence ?Living Arrangements: Alone;Other (Comment) (has pets) ?Available Help at Discharge: Family;Available PRN/intermittently ?Type of Home: House ?Home Access: Level entry ?  ?  ?  ?Home Layout: Two  level;Able to live on main level with bedroom/bathroom ?Home Equipment: None ?   ?  ?Prior Function Prior Level of Function : Independent/Modified Independent ?  ?  ?  ?  ?  ?  ?Mobility Comments: no AD use ?ADLs Comments: pt is a Marine scientist, works from home for [employer] ?  ? ? ?Hand Dominance  ?   ? ?  ?Extremity/Trunk Assessment  ? Upper Extremity Assessment ?Upper Extremity Assessment: Defer to OT evaluation ?  ? ?Lower Extremity Assessment ?Lower Extremity Assessment: LLE deficits/detail ?LLE Deficits / Details: Acute pain and decreased strength and AROM consistent with above mentioned surgery. ?  ? ?Cervical / Trunk Assessment ?Cervical / Trunk Assessment: Other exceptions ?Cervical / Trunk Exceptions: L rib fractures  ?Communication  ? Communication: No difficulties  ?Cognition Arousal/Alertness: Awake/alert ?Behavior During Therapy: [facility] for tasks assessed/performed, Anxious ?Overall Cognitive Status: Within Functional Limits for tasks assessed ?  ?  ?  ?  ?  ?  ?  ?  ?  ?  ?  ?  ?  ?  ?  ?  ?General Comments: anxious/cautious with movement ?  ?  ? ?  ?General Comments General comments (skin integrity, edema, etc.): VSS on RA, educated pt on use of BSC/3in1 for bathing/shower chair use ? ?  ?Exercises General Exercises - Lower Extremity ?Ankle Circles/Pumps: 10 reps ?Quad Sets: 10 reps ?Short Arc Quad: 10 reps ?Long Arc Quad: 10 reps ?Heel Slides:  (Verbally reviewed) ?Hip ABduction/ADduction: 10 reps  ? ?Assessment/Plan  ?  ?PT Assessment Patient needs continued PT services  ?PT Problem List Decreased strength;Decreased range of motion;Decreased activity tolerance;Decreased balance;Decreased mobility;Decreased knowledge of use of DME;Decreased safety awareness;Decreased knowledge of precautions;Pain ? ?   ?  ?PT Treatment Interventions DME instruction;Gait training;Stair training;Functional mobility training;Therapeutic activities;Therapeutic exercise;Neuromuscular re-education;Patient/family education   ? ?PT Goals  (Current goals can be found in the Care Plan section)  ?Acute Rehab PT Goals ?Patient Stated Goal: Home today ?PT Goal Formulation: With patient ?Time For Goal Achievement: 03/30/22 ?Potential to Achieve Goals: Good ? ?  ?Frequency Min 5X/week ?  ? ? ?Co-evaluation   ?  ?  ?  ?  ? ? ?  ?AM-PAC PT "6 Clicks" Mobility  ?Outcome Measure Help needed turning from your back to your side while in a flat bed without using bedrails?: None ?Help needed moving from lying on your back to sitting on the side of a flat bed without using bedrails?: A Little ?Help needed moving to and from a bed to a chair (including a wheelchair)?: A Little ?Help needed standing up from a chair using your arms (e.g., wheelchair or bedside chair)?: A Little ?Help needed to walk in hospital room?: A Little ?Help needed climbing 3-5 steps with a railing? : A Little ?6 Click Score: 19 ? ?  ?End of Session Equipment Utilized During Treatment: Gait belt ?Activity Tolerance: Patient limited by pain ?Patient left: in chair;with call bell/phone within reach;with chair alarm set ?Nurse Communication: Mobility status ?PT Visit Diagnosis: Unsteadiness on feet (R26.81);Pain ?Pain - Right/Left: Left ?Pain - part of body: Hip (ribs) ?  ? ?Time: 1019-1050 ?PT Time Calculation (min) (ACUTE ONLY): 31 min ? ? ?Charges:   PT Evaluation ?$PT Eval Moderate Complexity: 1 Mod ?PT  Treatments ?$Gait Training: 8-22 mins ?  ?   ? ? ?Rolinda Roan, PT, DPT ?Acute Rehabilitation Services ?Pager: [phone] ?Office: [phone]  ? ?Thelma Comp ?03/23/2022, 1:11 PM ? ?

## 2022-03-23 NOTE — Evaluation (Signed)
Occupational Therapy Evaluation ?Patient Details ?Name: Alison [name] ?MRN: [id number] ?DOB: Aug 02, 1971 ?Today's Date: 03/23/2022 ? ? ?History of Present Illness Pt is a 51 y/o F presenting to ED on 3/25 after falling off horse. Workup showing L intertrochanteric hip fx. s/p IM nail on 3/26. PMH includes DJD of lumbar region, HTN, and ADHD  ? ?Clinical Impression ?  ?Pt reports independence at baseline with ADLs and functional mobility, lives alone, has family able to assist at d/c. Pt currently supervision-min guard A for ADLs and transfers with RW. Pt min A for bed mobility, requiring light assist to bring LLE to EOB. Pt anxious with movement/pain, moves cautiously throughout session. Educated pt on use of recommended BSC/3in1 as shower seat for walk in shower, and how it can be used in tub, as well as safety measures of ADLs at home. Pt presenting with impairments listed below, will follow acutely. Recommend d/c home with assistance.   ?   ? ?Recommendations for follow up therapy are one component of a multi-disciplinary discharge planning process, led by the attending physician.  Recommendations may be updated based on patient status, additional functional criteria and insurance authorization.  ? ?Follow Up Recommendations ? No OT follow up  ?  ?Assistance Recommended at Discharge Intermittent Supervision/Assistance  ?Patient can return home with the following A little help with walking and/or transfers;A little help with bathing/dressing/bathroom;Assistance with cooking/housework;Assist for transportation;Help with stairs or ramp for entrance ? ?  ?Functional Status Assessment ? Patient has had a recent decline in their functional status and demonstrates the ability to make significant improvements in function in a reasonable and predictable amount of time.  ?Equipment Recommendations ? BSC/3in1;Other (comment) (RW)  ?  ?Recommendations for Other Services PT consult ? ? ?  ?Precautions / Restrictions  Precautions ?Precautions: Fall ?Restrictions ?Weight Bearing Restrictions: Yes ?LLE Weight Bearing: Weight bearing as tolerated  ? ?  ? ?Mobility Bed Mobility ?Overal bed mobility: Needs Assistance ?Bed Mobility: Supine to Sit ?  ?  ?Supine to sit: Min assist ?  ?  ?General bed mobility comments: min A to assist LLE to EOB ?  ? ?Transfers ?Overall transfer level: Needs assistance ?Equipment used: Rolling walker (2 wheels) ?Transfers: Sit to/from Stand ?Sit to Stand: Min guard ?  ?  ?  ?  ?  ?  ?  ? ?  ?Balance   ?  ?  ?  ?  ?  ?  ?  ?  ?  ?  ?  ?  ?  ?  ?  ?  ?  ?  ?   ? ?ADL either performed or assessed with clinical judgement  ? ?ADL Overall ADL's : Needs assistance/impaired ?Eating/Feeding: Set up;Sitting ?  ?Grooming: Set up;Sitting;Standing ?Grooming Details (indicate cue type and reason): to comb hair, oral care standing at sink ?Upper Body Bathing: Supervision/ safety;Sitting ?  ?Lower Body Bathing: Supervison/ safety;Sitting/lateral leans ?  ?Upper Body Dressing : Supervision/safety;Sitting ?Upper Body Dressing Details (indicate cue type and reason): completed sitting in chair ?Lower Body Dressing: Moderate assistance;Sit to/from stand ?Lower Body Dressing Details (indicate cue type and reason): to don shorts ?Toilet Transfer: Min guard;Rolling walker (2 wheels);Ambulation;Regular Toilet ?  ?Toileting- Clothing Manipulation and Hygiene: Supervision/safety;Sit to/from stand;Sitting/lateral lean ?Toileting - Clothing Manipulation Details (indicate cue type and reason): for pericare ?  ?  ?  ?   ? ? ? ?Vision   ?Vision Assessment?: No apparent visual deficits  ?   ?Perception   ?  ?  Praxis   ?  ? ?Pertinent Vitals/Pain Pain Assessment ?Pain Assessment: Faces ?Pain Score: 4  ?Faces Pain Scale: Hurts little more ?Pain Location: LLE and ribs with mobility ?Pain Descriptors / Indicators: Aching, Constant, Discomfort ?Pain Intervention(s): Limited activity within patient's tolerance, Monitored during session,  Repositioned  ? ? ? ?Hand Dominance   ?  ?Extremity/Trunk Assessment Upper Extremity Assessment ?Upper Extremity Assessment: Overall WFL for tasks assessed ?  ?Lower Extremity Assessment ?Lower Extremity Assessment: Defer to PT evaluation ?  ?Cervical / Trunk Assessment ?Cervical / Trunk Assessment: Normal ?  ?Communication Communication ?Communication: No difficulties ?  ?Cognition Arousal/Alertness: Awake/alert ?[facility] tasks assessed/performed, Anxious ?Overall Cognitive Status: Within Functional Limits for tasks assessed ?  ?  ?  ?  ?  ?  ?  ?  ?  ?  ?  ?  ?  ?  ?  ?  ?General Comments: anxious/cautious with movement ?  ?  ?General Comments  VSS on RA, educated pt on use of BSC/3in1 for bathing/shower chair use ? ?  ?Exercises   ?  ?Shoulder Instructions    ? ? ?Home Living Family/patient expects to be discharged to:: Private residence ?Living Arrangements: Alone;Other (Comment) (has pets) ?Available Help at Discharge: Family;Available PRN/intermittently ?Type of Home: House ?Home Access: Level entry ?  ?  ?Home Layout: Two level;Able to live on main level with bedroom/bathroom ?  ?  ?Bathroom Shower/Tub: Tub/shower unit ?  ?Bathroom Toilet: Standard ?Bathroom Accessibility: Yes ?How Accessible: Accessible via walker ?Home Equipment: None ?  ?  ?  ? ?  ?Prior Functioning/Environment Prior Level of Function : Independent/Modified Independent ?  ?  ?  ?  ?  ?  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  ? ?  ?  ?OT Problem List: Decreased strength;Decreased range of motion;Decreased activity tolerance;Impaired balance (sitting and/or standing);Decreased knowledge of use of DME or AE ?  ?   ?OT Treatment/Interventions: Self-care/ADL training;Therapeutic exercise;DME and/or AE instruction;Therapeutic activities;Patient/family education;Balance training  ?  ?OT Goals(Current goals can be found in the care plan section) Acute Rehab OT Goals ?Patient Stated Goal: to go  home ?OT Goal Formulation: With patient ?Time For Goal Achievement: 04/06/22 ?Potential to Achieve Goals: Good ?ADL Goals ?Pt Will Perform Upper Body Dressing: Independently;standing;sitting ?Pt Will Perform Lower Body Dressing: with supervision;sitting/lateral leans;sit to/from stand;with adaptive equipment ?Pt Will Transfer to Toilet: with supervision;regular height toilet;ambulating ?Pt Will Perform Tub/Shower Transfer: with min guard assist;rolling walker;shower seat;ambulating;Tub transfer;Shower transfer  ?OT Frequency: Min 2X/week ?  ? ?Co-evaluation   ?  ?  ?  ?  ? ?  ?AM-PAC OT "6 Clicks" Daily Activity     ?Outcome Measure Help from another person eating meals?: None ?Help from another person taking care of personal grooming?: None ?Help from another person toileting, which includes using toliet, bedpan, or urinal?: A Little ?Help from another person bathing (including washing, rinsing, drying)?: A Little ?Help from another person to put on and taking off regular upper body clothing?: None ?Help from another person to put on and taking off regular lower body clothing?: A Lot ?6 Click Score: 20 ?  ?End of Session Equipment Utilized During Treatment: Gait belt;Rolling walker (2 wheels) ?Nurse Communication: Mobility status ? ?Activity Tolerance: Patient tolerated treatment well ?Patient left: in chair;with call bell/phone within reach;with chair alarm set ? ?OT Visit Diagnosis: Unsteadiness on feet (R26.81);Other abnormalities of gait and mobility (R26.89);Muscle weakness (generalized) (M62.81);History of falling (  Z91.81)  ?              ?Time: 5053-9767 ?OT Time Calculation (min): 39 min ?Charges:  OT General Charges ?$OT Visit: 1 Visit ?OT Evaluation ?$OT Eval Moderate Complexity: 1 Mod ?OT Treatments ?$Self Care/Home Management : 23-37 mins ? ?Alfonzo Beers, OTD, OTR/L ?Acute Rehab ?[phone] ? ?Mayer Masker ?03/23/2022, 11:49 AM ?

## 2022-03-23 NOTE — Progress Notes (Signed)
Nutrition Brief Note ? ?RD consulted to assess pt due to closed L hip fx. Pt is day 1 post-op from Cephalomedullary nail of L hip fx.  ? ?Admitting Dx: Closed left hip fracture ([place]) [S72.002A] ?Closed fracture of left hip, initial encounter ([place]) [S72.002A] ?PMH:  ?Past Medical History:  ?Diagnosis Date  ? Adult ADHD   ? Degeneration of intervertebral disc of lumbar region   ? Hypertension   ? ?Medications: colace ?Labs: ?Recent Labs  ?Lab 03/21/22 ?1920 03/22/22 ?0327  ?NA 141 137  ?K 3.6 3.6  ?CL 108 104  ?CO2 24 25  ?BUN 10 9  ?CREATININE 0.73 0.70  ?CALCIUM 8.8* 9.1  ?GLUCOSE 126* 122*  ? ?Wt Readings from Last 15 Encounters:  ?03/22/22 74.8 kg  ?05/14/21 75.9 kg  ?05/14/20 69.9 kg  ?01/26/20 70.3 kg  ?09/19/19 70.3 kg  ?06/20/19 72.1 kg  ?05/04/19 70.8 kg  ?11/02/18 72.7 kg  ?08/08/18 72.8 kg  ?05/09/18 74.7 kg  ?01/30/16 73.5 kg  ?Body mass index is 27.44 kg/m?Marland Kitchen Patient meets criteria for overweight based on current BMI.  ? ?Current diet order is regular and pt denies any recent changes to appetite and/or weight. Pt reports feeling well overall and is requesting to be discharged home. Pt noted to have been cleared for d/c by orthopedics and is pending PT evaluation prior to discharge. Otherwise, d/c orders have been signed and pt to go home today with assistance from her daughter. ? ?No nutrition interventions warranted at this time. If nutrition issues arise, please consult RD.  ? ? ?Theone Stanley., MS, RD, LDN (she/her/hers) ?RD pager number and weekend/on-call pager number located in [place]. ? ? ? ? ?

## 2022-03-23 NOTE — Discharge Summary (Addendum)
? ?Physician Discharge Summary  ?Alison [name]:[id number] DOB: 31-Aug-1971 DOA: 03/21/2022 ? ?PCP: Willow Ora, MD ? ?Admit date: 03/21/2022 ?Discharge date: 03/23/2022 ? ?Admitted From: Home ?Discharge disposition: Home ? ?Recommendations at discharge:  ?Follow-up with PCP for the vitamin levels sent. ?Follow-up with orthopedics in 2 weeks for staple removal. ?Continue deep breathing exercises, incentive spirometry at home to prevent atelectasis and pneumonia due to rib fractures. ? ? ?Brief narrative: ?Alison [name] is a 51 y.o. female with PMH significant for essential hypertension, ADHD and lumbar spine degenerative disc disease. ?Patient presented to the ED from home after she had a fall from her horse sustaining acute left hip pain.  ?Hemodynamically stable in the ED ?Left hip and pelvic x-ray showed displaced intertrochanteric left hip fracture. ?Chest x-ray showed right sixth and seventh rib fractures ?CT head and CT C-spine did not show any acute intracranial abnormality or cervical fracture/malalignment.  ? ?Patient was given IV pain meds, IV antiemetics. ?Admitted to hospitalist service. ? ?Subjective: ?Patient was seen and examined this morning.  Sitting up in chair.  Not in distress. ?Wants to go home today. ? ?Principal Problem: ?  Closed left hip fracture (HCC) ?Active Problems: ?  Multiple fractures of ribs, right side, initial encounter for closed fracture ?  Essential hypertension ?  Attention deficit hyperactivity disorder ?  Major depression ?  ? ?Assessment and Plan: ?Closed left hip fracture ?Vitamin D deficiency ?-Secondary to mechanical fall ?-Orthopedic consultation obtained with Dr. Susa Simmonds ?-Underwent ORIF on 3/26. ?-Per orthopedics, pain management with hydrocodone, Robaxin. ?-DVT prophylaxis per orthopedics with aspirin full-strength for a month. ?-To follow-up with orthopedics in 2 weeks for staple removal and follow-up x-ray ?-Vitamin D level low at 18.  Prescription sent for  50,000 units of vitamin D weekly for 8 weeks ? ?Multiple fractures of ribs, right side,  ?-Right sixth and seventh ribs on complicated traumatic fracture  ?-Pain management, incentive spirometry, breathing exercises, lidocaine patch ? ?Essential hypertension ?-Continue Toprol and HCTZ. ? ?Macrocytosis ?-Hemoglobin level is stable but MCV elevated to over 100 from at least a year ago. ?-Check vitamin B12 level which is significantly low at 107.  1 dose of IM vitamin B12 shot given today.  Oral 1000 mcg daily to be started from tomorrow.  Patient to follow-up with PCP for further IM injections. ?-Folic acid level normal. ?Recent Labs  ?  05/14/21 ?1124 03/21/22 ?1920 03/22/22 ?0327  ?HGB 15.3* 14.3 13.9  ?MCV 107.7* 102.3* 103.6*  ?[id number]  --   --  107*  ?FOLATE  --   --  7.3  ? ?Attention deficit hyperactivity disorder ?-Patient states she has been managing without Aderall XR for a while. ? ?Major depression ?-continue Lexapro. ? ?Thyroid mass ?-CT scan showed 2.7 cm left lower pole thyroid mass.  She is status post FNA biopsy on 06/05/2021.  On chart review, I noted that it was reported as follicular lesion of undetermined at significance.  No gross change in size and current scan. ? ?Goals of care ?  Code Status: Full Code  ? ? ? ?Wounds:  ?- ?Incision (Closed) 03/22/22 Hip Left (Active)  ?Date First Assessed/Time First Assessed: 03/22/22 1038   Location: Hip  Location Orientation: Left  ?  ?Assessments 03/22/2022 11:32 AM 03/22/2022  2:00 PM  ?Dressing Type Foam - Lift dressing to assess site every shift;Compression wrap Foam - Lift dressing to assess site every shift  ?Dressing Clean, Dry, Intact Clean, Dry, Intact  ?Site / Wound  Assessment Dressing in place / Unable to assess Dressing in place / Unable to assess  ?Drainage Amount None None  ?   ?No Linked orders to display  ? ? ?Discharge Exam:  ? ?Vitals:  ? 03/22/22 2102 03/23/22 0110 03/23/22 0531 03/23/22 [id number]  ?BP: 126/83 132/86 127/82 112/70  ?Pulse: 80 76  70 67  ?Resp: 14 16 14 16   ?Temp: 98.2 ?F (36.8 ?C) 97.9 ?F (36.6 ?C) 98.2 ?F (36.8 ?C) 98.2 ?F (36.8 ?C)  ?TempSrc: Oral Oral Oral Oral  ?SpO2: 98% 96% 91% 99%  ?Weight:      ?Height:      ? ? ?Body mass index is 27.44 kg/m?.  ?General exam: Pleasant, middle-aged Caucasian female.  Pain controlled ?Skin: No rashes, lesions or ulcers. ?HEENT: Atraumatic, normocephalic, no obvious bleeding ?Lungs: Clear to auscultation bilaterally ?CVS: Regular rate and rhythm, no murmur ?GI/Abd soft, nontender, nondistended, bowel sound present ?CNS: Alert, awake, oriented x3 ?Psychiatry: Mood appropriate ?Extremities: No pedal edema, no calf tenderness ? ?Follow ups:  ? ? Follow-up Information   ? ? Erle Crocker, MD Follow up in 2 week(s).   ?Specialty: Orthopedic Surgery ?Contact information: ?[address] ?[place] [address] ?[phone] ? ? ?  ?  ? ? Leamon Arnt, MD Follow up.   ?Specialty: Family Medicine ?Contact information: ?[address] ?[place] [address] ?[id number] ? ? ?  ?  ? ?  ?  ? ?  ? ? ?Discharge Instructions:  ? ?Discharge Instructions   ? ? Call MD for:  difficulty breathing, headache or visual disturbances   Complete by: As directed ?  ? Call MD for:  extreme fatigue   Complete by: As directed ?  ? Call MD for:  hives   Complete by: As directed ?  ? Call MD for:  persistant dizziness or light-headedness   Complete by: As directed ?  ? Call MD for:  persistant nausea and vomiting   Complete by: As directed ?  ? Call MD for:  severe uncontrolled pain   Complete by: As directed ?  ? Call MD for:  temperature >100.4   Complete by: As directed ?  ? Diet general   Complete by: As directed ?  ? Discharge instructions   Complete by: As directed ?  ? Recommendations at discharge:  ?? Follow-up with PCP for the vitamin levels sent. ?? Follow-up with orthopedics in 2 weeks for staple removal. ?? Continue deep breathing exercises, incentive spirometry at home to prevent atelectasis and pneumonia due  to rib fractures. ? ?General discharge instructions: ?Follow with Primary MD Leamon Arnt, MD in 7 days  ?Please request your PCP  to go over your hospital tests, procedures, radiology results at the follow up. Please get your medicines reviewed and adjusted.  Your PCP may decide to repeat certain labs or tests as needed. ?Do not drive, operate heavy machinery, perform activities at heights, swimming or participation in water activities or provide baby sitting services if your were admitted for syncope or siezures until you have seen by Primary MD or a Neurologist and advised to do so again. ?[place] Controlled Substance Reporting System database was reviewed. Do not drive, operate heavy machinery, perform activities at heights, swim, participate in water activities or provide baby-sitting services while on medications for pain, sleep and mood until your outpatient physician has reevaluated you and advised to do so again.  You are strongly recommended to comply with the dose, frequency and  duration of prescribed medications. ?Activity: As tolerated with Full fall precautions use walker/cane & assistance as needed ?Avoid using any recreational substances like cigarette, tobacco, alcohol, or non-prescribed drug. ?If you experience worsening of your admission symptoms, develop shortness of breath, life threatening emergency, suicidal or homicidal thoughts you must seek medical attention immediately by calling 911 or calling your MD immediately  if symptoms less severe. ?You must read complete instructions/literature along with all the possible adverse reactions/side effects for all the medicines you take and that have been prescribed to you. Take any new medicine only after you have completely understood and accepted all the possible adverse reactions/side effects.  ?Wear Seat belts while driving. ?You were cared for by a hospitalist during your hospital stay. If you have any questions about your discharge  medications or the care you received while you were in the hospital after you are discharged, you can call the unit and ask to speak with the hospitalist or the covering physician. Once you are discharged

## 2022-03-23 NOTE — TOC CAGE-AID Note (Signed)
Transition of Care (TOC) - CAGE-AID Screening ? ? ?Patient Details  ?Name: Alison [name] ?MRN: [id number] ?Date of Birth: 03-Sep-1971 ? ?Transition of Care (TOC) CM/SW Contact:    ?Olusegun Gerstenberger C Tarpley-Carter, LCSWA ?Phone Number: ?03/23/2022, 2:09 PM ? ? ?Clinical Narrative: ?Pt participated in Cage-Aid.  Pt stated she does not use substance or ETOH.  Pt was not offered resources, due to no usage of substance or ETOH.    ? ?Insurance underwriter, MSW, LCSW-A ?Pronouns:  She/Her/Hers ?[facility] ?Clinical Social Worker ?Direct Number:  [phone] ?[email or web address]  ? ?CAGE-AID Screening: ?  ? ?Have You Ever Felt You Ought to Cut Down on Your Drinking or Drug Use?: No ?Have People Annoyed You By Critizing Your Drinking Or Drug Use?: No ?Have You Felt Bad Or Guilty About Your Drinking Or Drug Use?: No ?Have You Ever Had a Drink or Used Drugs First Thing In The Morning to Steady Your Nerves or to Get Rid of a Hangover?: No ?CAGE-AID Score: 0 ? ?Substance Abuse Education Offered: No ? ?  ? ? ? ? ? ? ?

## 2022-03-25 ENCOUNTER — Encounter (HOSPITAL_COMMUNITY): Payer: Self-pay | Admitting: Orthopaedic Surgery

## 2022-03-26 NOTE — Patient Outreach (Signed)
Received a hospital discharge notification for [name] from [employer] and received a [employer] Notification for the question Scheduled a follow-up appointment? Answered No. ? ?I have sent a message to the [facility] Care Guide team to call for follow up and determine if there are any Case Management needs.  ?  ?Alison [name], CBCS, CMAA ?[facility] Care Management Assistant ?[facility] Care Management ?[phone]   ?

## 2022-03-27 ENCOUNTER — Other Ambulatory Visit (HOSPITAL_COMMUNITY): Payer: Self-pay

## 2022-03-27 MED ORDER — HYDROCODONE-ACETAMINOPHEN 5-325 MG PO TABS
ORAL_TABLET | ORAL | 0 refills | Status: DC
  Filled 2022-03-27: qty 30, 5d supply, fill #0

## 2022-04-02 ENCOUNTER — Telehealth: Payer: Self-pay | Admitting: *Deleted

## 2022-04-02 NOTE — Chronic Care Management (AMB) (Signed)
?  Care Management  ? ?Outreach Note ? ?04/02/2022 ?Name: SERAH NICOLETTI MRN: [id number] DOB: [birth year] ? ?Referred by: Willow Ora, MD ?Reason for referral : Care Coordination (Initial outreach to schedule initial with [facility] ) ? ? ?An unsuccessful telephone outreach was attempted today. The patient was referred to the case management team for assistance with care management and care coordination.  ? ?Follow Up Plan:  ?A HIPAA compliant phone message was left for the patient providing contact information and requesting a return call.  ? ?Magon Croson, CCMA ?Care Guide, Embedded Care Coordination ?[place]  Care Management  ?Direct Dial: [phone] ? ? ?

## 2022-04-06 DIAGNOSIS — S72142D Displaced intertrochanteric fracture of left femur, subsequent encounter for closed fracture with routine healing: Secondary | ICD-10-CM | POA: Diagnosis not present

## 2022-04-06 DIAGNOSIS — Z9889 Other specified postprocedural states: Secondary | ICD-10-CM | POA: Diagnosis not present

## 2022-04-16 NOTE — Chronic Care Management (AMB) (Signed)
?  Care Management  ? ?Note ? ?04/16/2022 ?Name: Alison [name] MRN: [id number] DOB: 04/06/1971 ? ?Alison [name] is a 51 y.o. year old female who is a primary care patient of Willow Ora, MD. I reached out to [name] by phone today offer care coordination services.  ? ?Alison [name] was given information about care management services today including:  ?Care management services include personalized support from designated clinical staff supervised by her physician, including individualized plan of care and coordination with other care providers ?24/7 contact phone numbers for assistance for urgent and routine care needs. ?The patient may stop care management services at any time by phone call to the office staff. ? ?Patient agreed to services and verbal consent obtained.  ? ?Follow up plan: ?Telephone appointment with care management team member scheduled for: 04/21/2022 ? ?Gorge Almanza, CCMA ?Care Guide, Embedded Care Coordination ?[place]  Care Management  ?Direct Dial: [phone] ? ? ?

## 2022-04-21 ENCOUNTER — Telehealth: Payer: 59

## 2022-04-21 ENCOUNTER — Telehealth: Payer: Self-pay | Admitting: *Deleted

## 2022-04-21 NOTE — Telephone Encounter (Signed)
?  Care Management  ? ?Outreach Note ? ?04/21/2022 ?Name: ARMANIE ULLMER MRN: [id number] DOB: [birth year] ? ?Referred by: Willow Ora, MD ?Reason for referral : Care Coordination (INITIAL) ? ? ?An unsuccessful telephone outreach was attempted today. The patient was referred to the case management team for assistance with care management and care coordination.  ? ?Follow Up Plan:  ?The care management team will reach out to the patient again over the next 30 days.  ? ?Rhae Lerner RN, MSN ?RN Care Management Coordinator  ?[facility] ?[phone] ?[email or web address] ? ?

## 2022-04-23 ENCOUNTER — Ambulatory Visit: Payer: 59 | Admitting: *Deleted

## 2022-04-23 NOTE — Patient Instructions (Signed)
Visit Information  Thank you for allowing me to share the care management and care coordination services that are available to you as part of your health plan and services through your primary care provider and medical home. Please reach out to me at [phone] if the care management/care coordination team may be of assistance to you in the future.   Shannon Kirkendall RN, MSN RN Care Management Coordinator  [facility] [phone] [email or web address]  

## 2022-04-23 NOTE — Chronic Care Management (AMB) (Signed)
?  Care Management  ? ?Outreach Note ? ?04/23/2022 ?Name: Alison [name] MRN: [id number] DOB: Aug 27, 1971 ? ?Referred by: Willow Ora, MD ?Reason for referral : Care Coordination (Case closure) ? ? ?Successful contact was made with the patient to discuss care management and care coordination services. Patient declines engagement at this time.   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 ? ?Follow Up Plan:  ?No further follow up required: as patient declines services and follow up at this time ? ?Rhae Lerner RN, MSN ?RN Care Management Coordinator  ?[facility] ?[phone] ?[email or web address] ? ?

## 2022-04-24 ENCOUNTER — Other Ambulatory Visit: Payer: Self-pay

## 2022-04-24 ENCOUNTER — Other Ambulatory Visit (HOSPITAL_COMMUNITY): Payer: Self-pay

## 2022-04-24 MED FILL — Metoprolol Succinate Tab ER 24HR 50 MG (Tartrate Equiv): ORAL | 90 days supply | Qty: 90 | Fill #2 | Status: CN

## 2022-04-24 MED FILL — Hydrochlorothiazide Cap 12.5 MG: ORAL | 90 days supply | Qty: 90 | Fill #2 | Status: AC

## 2022-04-30 ENCOUNTER — Other Ambulatory Visit (HOSPITAL_COMMUNITY): Payer: Self-pay

## 2022-04-30 MED FILL — Metoprolol Succinate Tab ER 24HR 50 MG (Tartrate Equiv): ORAL | 90 days supply | Qty: 90 | Fill #2 | Status: AC

## 2022-05-08 ENCOUNTER — Other Ambulatory Visit (HOSPITAL_COMMUNITY): Payer: Self-pay

## 2022-05-09 ENCOUNTER — Other Ambulatory Visit (HOSPITAL_COMMUNITY): Payer: Self-pay

## 2022-05-09 ENCOUNTER — Other Ambulatory Visit: Payer: Self-pay

## 2022-05-11 ENCOUNTER — Other Ambulatory Visit (HOSPITAL_COMMUNITY): Payer: Self-pay

## 2022-06-16 ENCOUNTER — Telehealth: Payer: 59 | Admitting: Physician Assistant

## 2022-06-16 DIAGNOSIS — L237 Allergic contact dermatitis due to plants, except food: Secondary | ICD-10-CM | POA: Diagnosis not present

## 2022-06-16 MED ORDER — PREDNISONE 10 MG PO TABS: ORAL_TABLET | ORAL | 0 refills | Status: AC

## 2022-06-16 MED ORDER — TRIAMCINOLONE ACETONIDE 0.1 % EX CREA: 1.0000 | TOPICAL_CREAM | Freq: Two times a day (BID) | CUTANEOUS | 0 refills | Status: DC

## 2022-06-16 NOTE — Patient Instructions (Signed)
[name], thank you for joining Piedad Climes, PA-C for today's virtual visit.  While this provider is not your primary care provider (PCP), if your PCP is located in our provider database this encounter information will be shared with them immediately following your visit.  Consent: (Patient) Alison [name] provided verbal consent for this virtual visit at the beginning of the encounter.  Current Medications:  Current Outpatient Medications:    docusate sodium (COLACE) 100 MG capsule, Take 1 capsule (100 mg total) by mouth 2 (two) times daily., Disp: 10 capsule, Rfl: 0   hydrochlorothiazide (MICROZIDE) 12.5 MG capsule, TAKE 1 CAPSULE BY MOUTH ONCE A DAY (Patient taking differently: Take 12.5 mg by mouth daily.), Disp: 90 capsule, Rfl: 3   HYDROcodone-acetaminophen (NORCO/VICODIN) 5-325 MG tablet, Take 1 tablet by mouth every 4-6 hours as needed for pain, Disp: 30 tablet, Rfl: 0   levonorgestrel (MIRENA, 52 MG,) 20 MCG/24HR IUD, 20 Intra Uterine Devices by Intrauterine route daily., Disp: , Rfl:    methocarbamol (ROBAXIN) 500 MG tablet, Take 1 tablet (500 mg total) by mouth every 6 (six) hours as needed for muscle spasms., Disp: 30 tablet, Rfl: 0   metoprolol succinate (TOPROL-XL) 50 MG 24 hr tablet, TAKE 1 TABLET BY MOUTH ONCE A DAY (Patient taking differently: Take 50 mg by mouth daily.), Disp: 90 tablet, Rfl: 3   cyanocobalamin 1000 MCG tablet, Take 1 tablet (1,000 mcg total) by mouth daily., Disp: 90 tablet, Rfl: 0   Medications ordered in this encounter:  No orders of the defined types were placed in this encounter.    *If you need refills on other medications prior to your next appointment, please contact your pharmacy*  Follow-Up: Call back or seek an in-person evaluation if the symptoms worsen or if the condition fails to improve as anticipated.  Other Instructions Keep skin clean and dry. You can apply the steroid ointment as directed -- avoiding use around lips  and eyes.  Take the steroid taper as directed. Symptoms should improve until fully resolved. If not, or if you note any new or worsening symptoms, let us or Dr. Mardelle Matte know ASAP. Please do not delay care.   Poison Ivy Dermatitis Poison ivy dermatitis is redness and soreness of the skin caused by chemicals in the leaves of the poison ivy plant. You may have very bad itching, swelling, a rash, and blisters. What are the causes? Touching a poison ivy plant. Touching something that has the chemical on it. This may include animals or objects that have come in contact with the plant. What increases the risk? Going outdoors often in wooded or [place] areas. Going outdoors without wearing protective clothing, such as closed shoes, long pants, and a long-sleeved shirt. What are the signs or symptoms?  Skin redness. Very bad itching. A rash that often includes bumps and blisters. The rash usually appears 48 hours after exposure, if you have been exposed before. If this is the first time you have been exposed, the rash may not appear until a week after exposure. Swelling. This may occur if the reaction is very bad. Symptoms usually last for 1-2 weeks. The first time you develop this condition, symptoms may last 3-4 weeks. How is this treated? This condition may be treated with: Hydrocortisone cream or calamine lotion to relieve itching. Oatmeal baths to soothe the skin. Medicines, such as over-the-counter antihistamine tablets. Oral steroid medicine for more severe reactions. Follow these instructions at home: Medicines Take or apply over-the-counter and  prescription medicines only as told by your doctor. Use hydrocortisone cream or calamine lotion as needed to help with itching. General instructions Do not scratch or rub your skin. Put a cold, wet cloth (cold compress) on the affected areas or take baths in cool water. This will help with itching. Avoid hot baths and showers. Take oatmeal  baths as needed. Use colloidal oatmeal. You can get this at a pharmacy or grocery store. Follow the instructions on the package. While you have the rash, wash your clothes right after you wear them. Keep all follow-up visits as told by your health care provider. This is important. How is this prevented?  Know what poison ivy looks like, so you can avoid it. This plant has three leaves with flowering branches on a single stem. The leaves are glossy. The leaves have uneven edges that come to a point at the front. If you touch poison ivy, wash your skin with soap and water right away. Be sure to wash under your fingernails. When hiking or camping, wear long pants, a long-sleeved shirt, tall socks, and hiking boots. You can also use a lotion on your skin that helps to prevent contact with poison ivy. If you think that your clothes or outdoor gear came in contact with poison ivy, rinse them off with a garden hose before you bring them inside your house. When doing yard work or gardening, wear gloves, long sleeves, long pants, and boots. Wash your garden tools and gloves if they come in contact with poison ivy. If you think that your pet has come into contact with poison ivy, wash him or her with pet shampoo and water. Make sure to wear gloves while washing your pet. Contact a doctor if: You have open sores in the rash area. You have more redness, swelling, or pain in the rash area. You have redness that spreads beyond the rash area. You have fluid, blood, or pus coming from the rash area. You have a fever. You have a rash over a large area of your body. You have a rash on your eyes, mouth, or genitals. Your rash does not get better after a few weeks. Get help right away if: Your face swells or your eyes swell shut. You have trouble breathing. You have trouble swallowing. These symptoms may be an emergency. Do not wait to see if the symptoms will go away. Get medical help right away. Call your  local emergency services (911 in the [place] Do not drive yourself to the hospital. Summary Poison ivy dermatitis is redness and soreness of the skin caused by chemicals in the leaves of the poison ivy plant. You may have skin redness, very bad itching, swelling, and a rash. Do not scratch or rub your skin. Take or apply over-the-counter and prescription medicines only as told by your doctor. This information is not intended to replace advice given to you by your health care provider. Make sure you discuss any questions you have with your health care provider. Document Revised: 09/29/2021 Document Reviewed: 09/29/2021 Elsevier Patient Education  2023 Elsevier Inc.    If you have been instructed to have an in-person evaluation today at a local Urgent Care facility, please use the link below. It will take you to a list of all of our available [facility], including address, phone number and hours of operation. Please do not delay care.  [facility]  If you or a family member do not have a primary care  provider, use the link below to schedule a visit and establish care. When you choose a [facility] primary care physician or advanced practice provider, you gain a long-term partner in health. Find a Primary Care Provider  Learn more about [facility]'s in-office and virtual care options: [facility]

## 2022-06-16 NOTE — Progress Notes (Signed)
Virtual Visit Consent   Alison [name], you are scheduled for a virtual visit with a [facility] provider today. Just as with appointments in the office, your consent must be obtained to participate. Your consent will be active for this visit and any virtual visit you may have with one of our providers in the next 365 days. If you have a MyChart account, a copy of this consent can be sent to you electronically.  As this is a virtual visit, video technology does not allow for your provider to perform a traditional examination. This may limit your provider's ability to fully assess your condition. If your provider identifies any concerns that need to be evaluated in person or the need to arrange testing (such as labs, EKG, etc.), we will make arrangements to do so. Although advances in technology are sophisticated, we cannot ensure that it will always work on either your end or our end. If the connection with a video visit is poor, the visit may have to be switched to a telephone visit. With either a video or telephone visit, we are not always able to ensure that we have a secure connection.  By engaging in this virtual visit, you consent to the provision of healthcare and authorize for your insurance to be billed (if applicable) for the services provided during this visit. Depending on your insurance coverage, you may receive a charge related to this service.  I need to obtain your verbal consent now. Are you willing to proceed with your visit today? Alison [name] has provided verbal consent on 06/16/2022 for a virtual visit (video or telephone). Piedad Climes, [place]  Date: 06/16/2022 7:50 AM  Virtual Visit via Video Note   I, Piedad Climes, connected with  Alison [name]  ([id number], 09/05/1971) on 06/16/22 at  7:45 AM EDT by a video-enabled telemedicine application and verified that I am speaking with the correct person using two identifiers.  Location: Patient: Virtual Visit  Location Patient: Home Provider: Virtual Visit Location Provider: Home Office   I discussed the limitations of evaluation and management by telemedicine and the availability of in person appointments. The patient expressed understanding and agreed to proceed.    History of Present Illness: Alison [name] is a 51 y.o. who identifies as a female who was assigned female at birth, and is being seen today for poison oak/ivy. Notes she was working out in the woods on Saturday. Notes her dog was with her and both were exposed to poison oak. Notes over the next day started developing itchy rash of face, neck, R lower calf, R wrist and some smaller spots on her L arm.   HPI: HPI  Problems:  Patient Active Problem List   Diagnosis Date Noted   Closed left hip fracture (HCC) 03/21/2022   Major depression 03/21/2022   Multiple fractures of ribs, right side, initial encounter for closed fracture 03/21/2022   Left thyroid nodule 06/03/2021   Urinary, incontinence, stress female 05/14/2021   Seasonal allergic rhinitis due to pollen 05/14/2021   Depression, major, single episode, mild (HCC) 05/14/2020   Attention deficit hyperactivity disorder 10/02/2019   Genital warts 10/02/2019   Osteopenia 10/02/2019   Goiter 06/20/2019   Essential hypertension 05/09/2018   IUD (intrauterine device) in place 05/09/2018   DJD (degenerative joint disease), lumbar 05/09/2018    Allergies: No Known Allergies Medications:  Current Outpatient Medications:    cyanocobalamin 1000 MCG tablet, Take 1 tablet (1,000 mcg total) by mouth  daily., Disp: 90 tablet, Rfl: 0   levonorgestrel (MIRENA, 52 MG,) 20 MCG/24HR IUD, 20 Intra Uterine Devices by Intrauterine route daily., Disp: , Rfl:    metoprolol succinate (TOPROL-XL) 50 MG 24 hr tablet, TAKE 1 TABLET BY MOUTH ONCE A DAY (Patient taking differently: Take 50 mg by mouth daily.), Disp: 90 tablet, Rfl: 3   predniSONE (DELTASONE) 10 MG tablet, Take 4 tablets (40 mg total) by  mouth daily with breakfast for 4 days, THEN 3 tablets (30 mg total) daily with breakfast for 4 days, THEN 2 tablets (20 mg total) daily with breakfast for 3 days, THEN 1 tablet (10 mg total) daily with breakfast for 3 days., Disp: 37 tablet, Rfl: 0   triamcinolone cream (KENALOG) 0.1 %, Apply 1 Application topically 2 (two) times daily., Disp: 30 g, Rfl: 0   hydrochlorothiazide (MICROZIDE) 12.5 MG capsule, TAKE 1 CAPSULE BY MOUTH ONCE A DAY (Patient not taking: Reported on 06/16/2022), Disp: 90 capsule, Rfl: 3  Observations/Objective: Patient is well-developed, well-nourished in no acute distress.  Resting comfortably at home.  Head is normocephalic, atraumatic.  No labored breathing. Speech is clear and coherent with logical content.  Patient is alert and oriented at baseline.  Erythematous papular rash noted of neck, face. Linear on her R wrist.   Assessment and Plan: 1. Allergic contact dermatitis due to plants, except food - triamcinolone cream (KENALOG) 0.1 %; Apply 1 Application topically 2 (two) times daily.  Dispense: 30 g; Refill: 0 - predniSONE (DELTASONE) 10 MG tablet; Take 4 tablets (40 mg total) by mouth daily with breakfast for 4 days, THEN 3 tablets (30 mg total) daily with breakfast for 4 days, THEN 2 tablets (20 mg total) daily with breakfast for 3 days, THEN 1 tablet (10 mg total) daily with breakfast for 3 days.  Dispense: 37 tablet; Refill: 0  Supportive measures and OTC medications reviewed. Start Triamcinolone to areas other than face, axilla or groin. Will start prednisone taper as directed.   Follow Up Instructions: I discussed the assessment and treatment plan with the patient. The patient was provided an opportunity to ask questions and all were answered. The patient agreed with the plan and demonstrated an understanding of the instructions.  A copy of instructions were sent to the patient via MyChart unless otherwise noted below.   The patient was advised to call back  or seek an in-person evaluation if the symptoms worsen or if the condition fails to improve as anticipated.  Time:  I spent 10 minutes with the patient via telehealth technology discussing the above problems/concerns.    Piedad Climes, PA-C

## 2022-08-19 ENCOUNTER — Ambulatory Visit: Payer: 59 | Admitting: Family Medicine

## 2022-08-19 ENCOUNTER — Encounter: Payer: Self-pay | Admitting: Family Medicine

## 2022-08-19 ENCOUNTER — Other Ambulatory Visit (HOSPITAL_COMMUNITY): Payer: Self-pay

## 2022-08-19 VITALS — BP 126/86 | HR 76 | Temp 98.3°F | Ht 65.0 in

## 2022-08-19 DIAGNOSIS — F900 Attention-deficit hyperactivity disorder, predominantly inattentive type: Secondary | ICD-10-CM

## 2022-08-19 DIAGNOSIS — I10 Essential (primary) hypertension: Secondary | ICD-10-CM

## 2022-08-19 DIAGNOSIS — S72002S Fracture of unspecified part of neck of left femur, sequela: Secondary | ICD-10-CM

## 2022-08-19 MED ORDER — AMPHETAMINE-DEXTROAMPHET ER 20 MG PO CP24
20.0000 mg | ORAL_CAPSULE | Freq: Every day | ORAL | 0 refills | Status: DC
Start: 2022-09-18 — End: 2023-01-06

## 2022-08-19 MED ORDER — AMPHETAMINE-DEXTROAMPHET ER 20 MG PO CP24
20.0000 mg | ORAL_CAPSULE | Freq: Every day | ORAL | 0 refills | Status: DC
Start: 2022-08-19 — End: 2023-01-06

## 2022-08-19 MED ORDER — AMPHETAMINE-DEXTROAMPHET ER 20 MG PO CP24: 20.0000 mg | ORAL_CAPSULE | Freq: Every day | ORAL | 0 refills | Status: DC

## 2022-08-19 MED ORDER — METOPROLOL SUCCINATE ER 50 MG PO TB24
50.0000 mg | ORAL_TABLET | Freq: Every day | ORAL | 3 refills | Status: DC
  Filled 2022-08-19: qty 90, 90d supply, fill #0
  Filled 2022-11-23: qty 90, 90d supply, fill #1
  Filled 2023-02-26: qty 90, 90d supply, fill #2
  Filled 2023-06-04: qty 90, 90d supply, fill #3

## 2022-08-19 MED ORDER — AMPHETAMINE-DEXTROAMPHET ER 20 MG PO CP24
20.0000 mg | ORAL_CAPSULE | Freq: Every day | ORAL | 0 refills | Status: DC
Start: 2022-11-17 — End: 2023-01-06

## 2022-08-19 MED ORDER — AMPHETAMINE-DEXTROAMPHET ER 20 MG PO CP24
20.0000 mg | ORAL_CAPSULE | Freq: Every day | ORAL | 0 refills | Status: DC
Start: 2022-12-17 — End: 2023-01-06

## 2022-08-19 MED ORDER — AMPHETAMINE-DEXTROAMPHET ER 20 MG PO CP24
ORAL_CAPSULE | ORAL | 0 refills | Status: DC
Start: 2022-08-19 — End: 2022-08-20
  Filled 2022-08-19: qty 30, 30d supply, fill #0

## 2022-08-19 MED ORDER — AMPHETAMINE-DEXTROAMPHET ER 20 MG PO CP24
20.0000 mg | ORAL_CAPSULE | Freq: Every day | ORAL | 0 refills | Status: DC
Start: 2023-01-16 — End: 2023-01-06

## 2022-08-19 NOTE — Progress Notes (Signed)
Subjective  CC:  Chief Complaint  Patient presents with   ADHD    Pt stated that she started back having short annention span and wanted to discuss getting back on Adderall    HPI: Alison [name] is a 51 y.o. female who presents to the office today to address the problems listed above in the chief complaint.  Patient is here today for follow up of ADD/ADHD. Had been well controlled on adderall xr 20 daily until earlier this year. She stopped purposefully; it was difficult for her to get here to get refills and wanted to see if she needed it. Now, with typical ADD sxs. Would like to restart. No Aes.  Fell from horse back in march sustaining multiple injuries including broken hip and ribs. Reveiwed notes. Fortunately has recovered well.   Htn: had been on toprol xl 50 daily and microzide 12.5 daily however since accident was having lightheadedness with bending forward. She self stopped the hctz and now feels well. Home bp readings are normal. 120s/70s on average. No cp or palpitations.   Assessment  1. Attention deficit hyperactivity disorder (ADHD), predominantly inattentive type   2. Essential hypertension   3. Closed fracture of left hip, sequela      Plan  ADD:  will restart Adderall xr 20. Printed Rxs for 6 months. Has worked well in past. Will monitor bp while on med HTN: well controlled on toprol xl 50. Continue home monitoring.  Hip fracture. Recovered.    Follow up: due cpe   No orders of the defined types were placed in this encounter.  Meds ordered this encounter  Medications   amphetamine-dextroamphetamine (ADDERALL XR) 20 MG 24 hr capsule    Sig: Take 1 capsule (20 mg total) by mouth daily.    Dispense:  30 capsule    Refill:  0   amphetamine-dextroamphetamine (ADDERALL XR) 20 MG 24 hr capsule    Sig: Take 1 capsule (20 mg total) by mouth daily.    Dispense:  30 capsule    Refill:  0   amphetamine-dextroamphetamine (ADDERALL XR) 20 MG 24 hr capsule    Sig:  Take 1 capsule (20 mg total) by mouth daily.    Dispense:  30 capsule    Refill:  0   amphetamine-dextroamphetamine (ADDERALL XR) 20 MG 24 hr capsule    Sig: Take 1 capsule (20 mg total) by mouth daily.    Dispense:  30 capsule    Refill:  0   amphetamine-dextroamphetamine (ADDERALL XR) 20 MG 24 hr capsule    Sig: Take 1 capsule (20 mg total) by mouth daily.    Dispense:  30 capsule    Refill:  0   amphetamine-dextroamphetamine (ADDERALL XR) 20 MG 24 hr capsule    Sig: Take 1 capsule (20 mg total) by mouth daily.    Dispense:  30 capsule    Refill:  0   metoprolol succinate (TOPROL-XL) 50 MG 24 hr tablet    Sig: Take 1 tablet (50 mg total) by mouth daily.    Dispense:  90 tablet    Refill:  3      I reviewed the patients updated PMH, FH, and SocHx.    Patient Active Problem List   Diagnosis Date Noted   Depression, major, single episode, mild (HCC) 05/14/2020    Priority: High   Attention deficit hyperactivity disorder 10/02/2019    Priority: High   Essential hypertension 05/09/2018    Priority: High   Osteopenia  10/02/2019    Priority: Medium    IUD (intrauterine device) in place 05/09/2018    Priority: Medium    DJD (degenerative joint disease), lumbar 05/09/2018    Priority: Medium    Genital warts 10/02/2019    Priority: Low   Goiter 06/20/2019    Priority: Low   Closed left hip fracture (HCC) 03/21/2022   Major depression 03/21/2022   Multiple fractures of ribs, right side, initial encounter for closed fracture 03/21/2022   Left thyroid nodule 06/03/2021   Urinary, incontinence, stress female 05/14/2021   Seasonal allergic rhinitis due to pollen 05/14/2021   Current Meds  Medication Sig   levonorgestrel (MIRENA, 52 MG,) 20 MCG/24HR IUD 20 Intra Uterine Devices by Intrauterine route daily.   triamcinolone cream (KENALOG) 0.1 % Apply 1 Application topically 2 (two) times daily.   [DISCONTINUED] metoprolol succinate (TOPROL-XL) 50 MG 24 hr tablet TAKE 1 TABLET  BY MOUTH ONCE A DAY (Patient taking differently: Take 50 mg by mouth daily.)    Allergies: Patient has No Known Allergies. Family History: Patient family history includes Anxiety disorder in her sister; Atrial fibrillation in her father; COPD in her father; Fibroids in her sister; Goiter in her mother; Healthy in her daughter; Heart disease in her father, maternal uncle, and mother; Hypertension in her father, mother, and sister; Lymphoma in her father; Stroke in her mother; Sudden Cardiac Death in her mother. Social History:  Patient  reports that she has quit smoking. Her smoking use included cigarettes. She has never used smokeless tobacco. She reports that she does not drink alcohol and does not use drugs.  Review of Systems: Constitutional: Negative for fever malaise or anorexia Cardiovascular: negative for chest pain Respiratory: negative for SOB or persistent cough Gastrointestinal: negative for abdominal pain  Objective  Vitals: BP 126/86   Pulse 76   Temp 98.3 F (36.8 C)   Ht 5\' 5"  (1.651 m)   SpO2 97%   BMI 27.44 kg/m  General: no acute distress , A&Ox3 HEENT: PEERL, conjunctiva normal, Oropharynx moist,neck is supple Cardiovascular:  RRR without murmur or gallop.  Respiratory:  Good breath sounds bilaterally, CTAB with normal respiratory effort Skin:  Warm, no rashes   Commons side effects, risks, benefits, and alternatives for medications and treatment plan prescribed today were discussed, and the patient expressed understanding of the given instructions. Patient is instructed to call or message via MyChart if he/she has any questions or concerns regarding our treatment plan. No barriers to understanding were identified. We discussed Red Flag symptoms and signs in detail. Patient expressed understanding regarding what to do in case of urgent or emergency type symptoms.  Medication list was reconciled, printed and provided to the patient in AVS. Patient instructions and  summary information was reviewed with the patient as documented in the AVS. This note was prepared with assistance of Dragon voice recognition software. Occasional wrong-word or sound-a-like substitutions may have occurred due to the inherent limitations of voice recognition software

## 2022-08-19 NOTE — Patient Instructions (Signed)
Please return for your annual complete physical; please come fasting.   I have printed your prescriptions for Adderall. Please keep in a safe place. I printed enough for 6 months.   If you have any questions or concerns, please don't hesitate to send me a message via MyChart or call the office at [phone]. Thank you for visiting with Alison [name] today! It's our pleasure caring for you.

## 2022-08-20 ENCOUNTER — Other Ambulatory Visit (HOSPITAL_COMMUNITY): Payer: Self-pay

## 2022-08-20 MED ORDER — AMPHETAMINE-DEXTROAMPHET ER 20 MG PO CP24
20.0000 mg | ORAL_CAPSULE | Freq: Every day | ORAL | 0 refills | Status: DC
  Filled 2022-11-23: qty 30, 30d supply, fill #0

## 2022-08-20 MED ORDER — AMPHETAMINE-DEXTROAMPHET ER 20 MG PO CP24
20.0000 mg | ORAL_CAPSULE | Freq: Every day | ORAL | 0 refills | Status: DC
  Filled 2022-10-20: qty 30, 30d supply, fill #0

## 2022-08-20 MED ORDER — AMPHETAMINE-DEXTROAMPHET ER 20 MG PO CP24
20.0000 mg | ORAL_CAPSULE | Freq: Every day | ORAL | 0 refills | Status: DC
  Filled 2022-09-18: qty 30, 30d supply, fill #0

## 2022-09-17 ENCOUNTER — Other Ambulatory Visit (HOSPITAL_COMMUNITY): Payer: Self-pay

## 2022-09-18 ENCOUNTER — Other Ambulatory Visit (HOSPITAL_COMMUNITY): Payer: Self-pay

## 2022-09-21 ENCOUNTER — Encounter: Payer: Self-pay | Admitting: *Deleted

## 2022-10-20 ENCOUNTER — Other Ambulatory Visit (HOSPITAL_COMMUNITY): Payer: Self-pay

## 2022-11-23 ENCOUNTER — Other Ambulatory Visit (HOSPITAL_COMMUNITY): Payer: Self-pay

## 2022-12-09 ENCOUNTER — Other Ambulatory Visit (HOSPITAL_COMMUNITY): Payer: Self-pay

## 2022-12-09 MED ORDER — AMPHETAMINE-DEXTROAMPHET ER 20 MG PO CP24
20.0000 mg | ORAL_CAPSULE | Freq: Every day | ORAL | 0 refills | Status: DC
  Filled 2023-02-05: qty 30, 30d supply, fill #0

## 2022-12-09 MED ORDER — AMPHETAMINE-DEXTROAMPHET ER 20 MG PO CP24
20.0000 mg | ORAL_CAPSULE | Freq: Every day | ORAL | 0 refills | Status: DC
  Filled 2022-12-30: qty 30, 30d supply, fill #0

## 2022-12-10 ENCOUNTER — Encounter: Payer: Self-pay | Admitting: *Deleted

## 2022-12-10 ENCOUNTER — Other Ambulatory Visit (HOSPITAL_COMMUNITY): Payer: Self-pay

## 2022-12-30 ENCOUNTER — Other Ambulatory Visit (HOSPITAL_COMMUNITY): Payer: Self-pay

## 2023-01-06 ENCOUNTER — Encounter: Payer: Self-pay | Admitting: Family Medicine

## 2023-01-06 ENCOUNTER — Ambulatory Visit (INDEPENDENT_AMBULATORY_CARE_PROVIDER_SITE_OTHER): Payer: Commercial Managed Care - PPO | Admitting: Family Medicine

## 2023-01-06 VITALS — BP 110/84 | HR 82 | Temp 97.8°F | Ht 65.0 in | Wt 169.8 lb

## 2023-01-06 DIAGNOSIS — E041 Nontoxic single thyroid nodule: Secondary | ICD-10-CM | POA: Diagnosis not present

## 2023-01-06 DIAGNOSIS — Z1211 Encounter for screening for malignant neoplasm of colon: Secondary | ICD-10-CM | POA: Diagnosis not present

## 2023-01-06 DIAGNOSIS — F320 Major depressive disorder, single episode, mild: Secondary | ICD-10-CM | POA: Diagnosis not present

## 2023-01-06 DIAGNOSIS — Z1212 Encounter for screening for malignant neoplasm of rectum: Secondary | ICD-10-CM | POA: Diagnosis not present

## 2023-01-06 DIAGNOSIS — E049 Nontoxic goiter, unspecified: Secondary | ICD-10-CM | POA: Diagnosis not present

## 2023-01-06 DIAGNOSIS — I10 Essential (primary) hypertension: Secondary | ICD-10-CM | POA: Diagnosis not present

## 2023-01-06 DIAGNOSIS — Z0000 Encounter for general adult medical examination without abnormal findings: Secondary | ICD-10-CM

## 2023-01-06 DIAGNOSIS — F900 Attention-deficit hyperactivity disorder, predominantly inattentive type: Secondary | ICD-10-CM | POA: Diagnosis not present

## 2023-01-06 NOTE — Patient Instructions (Signed)
Please return in 6 months for ADD f/u  Please schedule a lab visit; return fasting.   Your thyroid f/u test was a genetic testing that was negative favoring a benign lesion. I recommend seeing an endocrinologist to ensure no further monitoring of the nodule is needed and have placed a referral.   If you have any questions or concerns, please don't hesitate to send me a message via MyChart or call the office at [phone]. Thank you for visiting with [place] today! It's our pleasure caring for you.

## 2023-01-06 NOTE — Progress Notes (Signed)
Subjective  Chief Complaint  Patient presents with   Annual Exam    HPI: Alison [name] is a 52 y.o. female who presents to [facility] today for a Female Wellness Visit. She also has the concerns and/or needs as listed above in the chief complaint. These will be addressed in addition to the Health Maintenance Visit.   Wellness Visit: annual visit with health maintenance review and exam without Pap  HM: to see GYN in march for mammogram, pap, IUD removal and female wellness exam. Imms current. Doing well overall. Due colorectal cancer screen; neg cologuard 3 years ago. No sxs Chronic disease f/u and/or acute problem visit: (deemed necessary to be done in addition to the wellness visit): HTN is controlled. Feeling well. Taking medications w/o adverse effects. No symptoms of CHF, angina; no palpitations, sob, cp or lower extremity edema. Compliant with meds.  ADD remains stable on adderall Reviewed goiter/left thyroid nodule: had indeterminate bx and neg affirma genome testing done in 2022 w/o further eval. No obstructive sxs. No sxs of high or low thyroid. Never evaluated by endocrine.   H/o depression; now resolved and off meds.   Assessment  1. Annual physical exam   2. Essential hypertension   3. Attention deficit hyperactivity disorder (ADHD), predominantly inattentive type   4. Depression, major, single episode, mild ([place])   5. Left thyroid nodule   6. Screening for colorectal cancer   7. Goiter      Plan  Female Wellness Visit: Age appropriate Health Maintenance and Prevention measures were discussed with patient. Included topics are cancer screening recommendations, ways to keep healthy (see AVS) including dietary and exercise recommendations, regular eye and dental care, use of seat belts, and avoidance of moderate alcohol use and tobacco use. Cologuard ordered and education given. To get mammo and pap at gyn in march BMI: discussed patient's BMI  and encouraged positive lifestyle modifications to help get to or maintain a target BMI. HM needs and immunizations were addressed and ordered. See below for orders. See HM and immunization section for updates. Routine labs and screening tests ordered including cmp, cbc and lipids where appropriate. She will return fasting Discussed recommendations regarding Vit D and calcium supplementation (see AVS)  Chronic disease management visit and/or acute problem visit: HTN is controlled. Continue Toprol xl 50 daily. Check renal function and electrolytes. ADD is controlled. Continue Adderall xr 20daily.  Depression: resolved. Monitor for recurrence Thyroid nodule and large goiter: prefer to have endocrinology review data and neck to ensure no further surveillance is recommended. Referral placed  Follow up: 6 mo for ADD  Orders Placed This Encounter  Procedures   Cologuard   CBC with Differential/Platelet   Comprehensive metabolic panel   Lipid panel   TSH   Ambulatory referral to Endocrinology   No orders of the defined types were placed in this encounter.     Body mass index is 28.26 kg/m. Wt Readings from Last 3 Encounters:  01/06/23 169 lb 12.8 oz (77 kg)  03/22/22 164 lb 14.5 oz (74.8 kg)  05/14/21 167 lb 6.4 oz (75.9 kg)     Patient Active Problem List   Diagnosis Date Noted   Depression, major, single episode, mild ([place]) 05/14/2020    Priority: High    Treated with lexapro successfully 04/2020, resolved    Attention deficit hyperactivity disorder 10/02/2019    Priority: High   Essential hypertension 05/09/2018    Priority: High   Osteopenia 10/02/2019  Priority: Medium    IUD (intrauterine device) in place 05/09/2018    Priority: Medium     Due out 2022    DJD (degenerative joint disease), lumbar 05/09/2018    Priority: Medium    Urinary, incontinence, stress female 05/14/2021    Priority: Low   Seasonal allergic rhinitis due to pollen 05/14/2021    Priority: Low    Genital warts 10/02/2019    Priority: Low   Goiter 06/20/2019    Priority: Low   Left thyroid nodule 06/03/2021    Large, noted on ultrasound and exam 05/2021; referred to endocrinology for FNA - indeterminant and referred to endocrinology    Health Maintenance  Topic Date Due   MAMMOGRAM  02/26/2022   Fecal DNA (Cologuard)  09/28/2022   PAP SMEAR-Modifier  11/27/2022   COVID-19 Vaccine (3 - 2023-24 season) 01/22/2023 (Originally 08/28/2022)   Zoster Vaccines- Shingrix (1 of 2) 04/07/2023 (Originally 06/25/2019)   DTaP/Tdap/Td (2 - Td or Tdap) 01/19/2027   INFLUENZA VACCINE  Completed   Hepatitis C Screening  Completed   HIV Screening  Completed   HPV VACCINES  Aged Out   Immunization History  Administered Date(s) Administered   Influenza-Unspecified 09/27/2016, 09/27/2018, 10/22/2020, 10/11/2022   PFIZER(Purple Top)SARS-COV-2 Vaccination 01/11/2020, 02/01/2020   Tdap 01/19/2017   We updated and reviewed the patient's past history in detail and it is documented below. Allergies: Patient has No Known Allergies. Past Medical History Patient  has a past medical history of Adult ADHD, Closed left hip fracture ([place]) (03/21/2022), Degeneration of intervertebral disc of lumbar region, and Hypertension. Past Surgical History Patient  has a past surgical history that includes Tonsillectomy; Lumbar disc surgery (2010); and Intramedullary (im) nail intertrochanteric (Left, 03/22/2022). Family History: Patient family history includes Anxiety disorder in her sister; Atrial fibrillation in her father; COPD in her father; Fibroids in her sister; Goiter in her mother; Healthy in her daughter; Heart disease in her father, maternal uncle, and mother; Hypertension in her father, mother, and sister; Lymphoma in her father; Stroke in her mother; Sudden Cardiac Death in her mother. Social History:  Patient  reports that she has quit smoking. Her smoking use included cigarettes. She has never used  smokeless tobacco. She reports that she does not drink alcohol and does not use drugs.  Review of Systems: Constitutional: negative for fever or malaise Ophthalmic: negative for photophobia, double vision or loss of vision Cardiovascular: negative for chest pain, dyspnea on exertion, or new LE swelling Respiratory: negative for SOB or persistent cough Gastrointestinal: negative for abdominal pain, change in bowel habits or melena Genitourinary: negative for dysuria or gross hematuria, no abnormal uterine bleeding or disharge Musculoskeletal: negative for new gait disturbance or muscular weakness Integumentary: negative for new or persistent rashes, no breast lumps Neurological: negative for TIA or stroke symptoms Psychiatric: negative for SI or delusions Allergic/Immunologic: negative for hives  Patient Care Team    Relationship Specialty Notifications Start End  Leamon Arnt, MD PCP - General Family Medicine  05/09/18   Molli Posey, MD Consulting Physician Obstetrics and Gynecology  05/09/18   Jovita Gamma, MD Consulting Physician Neurosurgery  05/09/18     Objective  Vitals: BP 110/84   Pulse 82   Temp 97.8 F (36.6 C)   Ht 5\' 5"  (1.651 m)   Wt 169 lb 12.8 oz (77 kg)   SpO2 97%   BMI 28.26 kg/m  General:  Well developed, well nourished, no acute distress  Psych:  Alert and orientedx3,normal  mood and affect HEENT:  Normocephalic, atraumatic, non-icteric sclera,  supple neck without adenopathy, mass + generalized nontender thyromegaly Cardiovascular:  Normal S1, S2, RRR without gallop, rub or murmur Respiratory:  Good breath sounds bilaterally, CTAB with normal respiratory effort Gastrointestinal: normal bowel sounds, soft, non-tender, no noted masses. No HSM MSK: no deformities, contusions. Joints are without erythema or swelling.  Skin:  Warm, no rashes or suspicious lesions noted Neurologic:    Mental status is normal. CN 2-11 are normal. Gross motor and sensory  exams are normal. Normal gait. No tremor  Commons side effects, risks, benefits, and alternatives for medications and treatment plan prescribed today were discussed, and the patient expressed understanding of the given instructions. Patient is instructed to call or message via MyChart if he/she has any questions or concerns regarding our treatment plan. No barriers to understanding were identified. We discussed Red Flag symptoms and signs in detail. Patient expressed understanding regarding what to do in case of urgent or emergency type symptoms.  Medication list was reconciled, printed and provided to the patient in AVS. Patient instructions and summary information was reviewed with the patient as documented in the AVS. This note was prepared with assistance of Dragon voice recognition software. Occasional wrong-word or sound-a-like substitutions may have occurred due to the inherent limitations of voice recognition software

## 2023-01-15 ENCOUNTER — Other Ambulatory Visit: Payer: Commercial Managed Care - PPO

## 2023-01-29 ENCOUNTER — Other Ambulatory Visit (INDEPENDENT_AMBULATORY_CARE_PROVIDER_SITE_OTHER): Payer: Commercial Managed Care - PPO

## 2023-01-29 DIAGNOSIS — E049 Nontoxic goiter, unspecified: Secondary | ICD-10-CM

## 2023-01-29 DIAGNOSIS — Z0000 Encounter for general adult medical examination without abnormal findings: Secondary | ICD-10-CM

## 2023-01-29 DIAGNOSIS — I10 Essential (primary) hypertension: Secondary | ICD-10-CM

## 2023-01-29 DIAGNOSIS — E041 Nontoxic single thyroid nodule: Secondary | ICD-10-CM

## 2023-01-29 LAB — COMPREHENSIVE METABOLIC PANEL
ALT: 31 U/L (ref 0–35)
AST: 33 U/L (ref 0–37)
Albumin: 4.2 g/dL (ref 3.5–5.2)
Alkaline Phosphatase: 94 U/L (ref 39–117)
BUN: 12 mg/dL (ref 6–23)
CO2: 28 mEq/L (ref 19–32)
Calcium: 9.1 mg/dL (ref 8.4–10.5)
Chloride: 104 mEq/L (ref 96–112)
Creatinine, Ser: 0.81 mg/dL (ref 0.40–1.20)
GFR: 82.77 mL/min (ref >60.00)
Glucose, Bld: 102 mg/dL — ABNORMAL HIGH (ref 70–99)
Potassium: 4.1 mEq/L (ref 3.5–5.1)
Sodium: 139 mEq/L (ref 135–145)
Total Bilirubin: 0.4 mg/dL (ref 0.2–1.2)
Total Protein: 6.7 g/dL (ref 6.0–8.3)

## 2023-01-29 LAB — CBC WITH DIFFERENTIAL/PLATELET
Basophils Absolute: 0.1 10*3/uL (ref 0.0–0.1)
Basophils Relative: 1.8 % (ref 0.0–3.0)
Eosinophils Absolute: 0.3 10*3/uL (ref 0.0–0.7)
Eosinophils Relative: 5.2 % — ABNORMAL HIGH (ref 0.0–5.0)
HCT: 40.8 % (ref 36.0–46.0)
Hemoglobin: 14 g/dL (ref 12.0–15.0)
Lymphocytes Relative: 31.5 % (ref 12.0–46.0)
Lymphs Abs: 1.7 10*3/uL (ref 0.7–4.0)
MCHC: 34.2 g/dL (ref 30.0–36.0)
MCV: 101.7 fl — ABNORMAL HIGH (ref 78.0–100.0)
Monocytes Absolute: 0.4 10*3/uL (ref 0.1–1.0)
Monocytes Relative: 6.7 % (ref 3.0–12.0)
Neutro Abs: 2.9 10*3/uL (ref 1.4–7.7)
Neutrophils Relative %: 54.8 % (ref 43.0–77.0)
Platelets: 247 10*3/uL (ref 150.0–400.0)
RBC: 4.01 Mil/uL (ref 3.87–5.11)
RDW: 13.4 % (ref 11.5–15.5)
WBC: 5.3 10*3/uL (ref 4.0–10.5)

## 2023-01-29 LAB — LIPID PANEL
Cholesterol: 211 mg/dL — ABNORMAL HIGH (ref 0–200)
HDL: 60.1 mg/dL (ref >39.00)
LDL Cholesterol: 129 mg/dL — ABNORMAL HIGH (ref 0–99)
NonHDL: 151.17
Total CHOL/HDL Ratio: 4
Triglycerides: 113 mg/dL (ref 0.0–149.0)
VLDL: 22.6 mg/dL (ref 0.0–40.0)

## 2023-01-29 LAB — TSH: TSH: 5.93 u[IU]/mL — ABNORMAL HIGH (ref 0.35–5.50)

## 2023-02-05 ENCOUNTER — Other Ambulatory Visit (HOSPITAL_COMMUNITY): Payer: Self-pay

## 2023-02-09 DIAGNOSIS — Z1212 Encounter for screening for malignant neoplasm of rectum: Secondary | ICD-10-CM | POA: Diagnosis not present

## 2023-02-09 DIAGNOSIS — Z1211 Encounter for screening for malignant neoplasm of colon: Secondary | ICD-10-CM | POA: Diagnosis not present

## 2023-02-20 LAB — COLOGUARD: COLOGUARD: NEGATIVE

## 2023-02-26 ENCOUNTER — Other Ambulatory Visit (HOSPITAL_COMMUNITY): Payer: Self-pay

## 2023-03-10 DIAGNOSIS — Z6826 Body mass index (BMI) 26.0-26.9, adult: Secondary | ICD-10-CM | POA: Diagnosis not present

## 2023-03-10 DIAGNOSIS — Z1231 Encounter for screening mammogram for malignant neoplasm of breast: Secondary | ICD-10-CM | POA: Diagnosis not present

## 2023-03-10 DIAGNOSIS — Z01419 Encounter for gynecological examination (general) (routine) without abnormal findings: Secondary | ICD-10-CM | POA: Diagnosis not present

## 2023-03-10 DIAGNOSIS — Z30432 Encounter for removal of intrauterine contraceptive device: Secondary | ICD-10-CM | POA: Diagnosis not present

## 2023-03-21 LAB — HM PAP SMEAR

## 2023-03-21 LAB — HM MAMMOGRAPHY

## 2023-03-26 ENCOUNTER — Other Ambulatory Visit (HOSPITAL_COMMUNITY): Payer: Self-pay

## 2023-03-26 ENCOUNTER — Other Ambulatory Visit: Payer: Self-pay | Admitting: Family Medicine

## 2023-03-27 ENCOUNTER — Other Ambulatory Visit (HOSPITAL_COMMUNITY): Payer: Self-pay

## 2023-03-29 ENCOUNTER — Other Ambulatory Visit (HOSPITAL_COMMUNITY): Payer: Self-pay

## 2023-03-29 MED ORDER — AMPHETAMINE-DEXTROAMPHET ER 20 MG PO CP24
20.0000 mg | ORAL_CAPSULE | Freq: Every day | ORAL | 0 refills | Status: DC
  Filled 2023-03-29: qty 30, 30d supply, fill #0

## 2023-05-05 ENCOUNTER — Other Ambulatory Visit: Payer: Self-pay | Admitting: Family Medicine

## 2023-05-05 ENCOUNTER — Other Ambulatory Visit (HOSPITAL_COMMUNITY): Payer: Self-pay

## 2023-05-05 MED ORDER — AMPHETAMINE-DEXTROAMPHET ER 20 MG PO CP24
20.0000 mg | ORAL_CAPSULE | Freq: Every day | ORAL | 0 refills | Status: DC
  Filled 2023-05-05: qty 23, 23d supply, fill #0
  Filled 2023-05-05: qty 7, 7d supply, fill #0

## 2023-06-04 ENCOUNTER — Other Ambulatory Visit (HOSPITAL_COMMUNITY): Payer: Self-pay

## 2023-06-29 ENCOUNTER — Other Ambulatory Visit (HOSPITAL_COMMUNITY): Payer: Self-pay

## 2023-06-29 ENCOUNTER — Other Ambulatory Visit: Payer: Self-pay | Admitting: Family Medicine

## 2023-06-30 ENCOUNTER — Other Ambulatory Visit (HOSPITAL_COMMUNITY): Payer: Self-pay

## 2023-06-30 MED ORDER — AMPHETAMINE-DEXTROAMPHET ER 20 MG PO CP24
20.0000 mg | ORAL_CAPSULE | Freq: Every day | ORAL | 0 refills | Status: DC
  Filled 2023-06-30: qty 30, 30d supply, fill #0

## 2023-07-13 ENCOUNTER — Ambulatory Visit: Payer: Commercial Managed Care - PPO | Admitting: Family Medicine

## 2023-07-13 ENCOUNTER — Encounter: Payer: Self-pay | Admitting: Family Medicine

## 2023-07-13 ENCOUNTER — Other Ambulatory Visit (HOSPITAL_COMMUNITY): Payer: Self-pay

## 2023-07-13 VITALS — BP 120/70 | HR 65 | Temp 97.6°F | Ht 65.0 in | Wt 162.4 lb

## 2023-07-13 DIAGNOSIS — E041 Nontoxic single thyroid nodule: Secondary | ICD-10-CM

## 2023-07-13 DIAGNOSIS — I10 Essential (primary) hypertension: Secondary | ICD-10-CM

## 2023-07-13 DIAGNOSIS — F900 Attention-deficit hyperactivity disorder, predominantly inattentive type: Secondary | ICD-10-CM

## 2023-07-13 MED ORDER — AMPHETAMINE-DEXTROAMPHET ER 20 MG PO CP24: 20.0000 mg | ORAL_CAPSULE | Freq: Every day | ORAL | 0 refills | Status: DC

## 2023-07-13 MED ORDER — AMPHETAMINE-DEXTROAMPHET ER 20 MG PO CP24
20.0000 mg | ORAL_CAPSULE | Freq: Every day | ORAL | 0 refills | Status: DC
  Filled 2023-12-16: qty 30, 30d supply, fill #0

## 2023-07-13 MED ORDER — AMPHETAMINE-DEXTROAMPHET ER 20 MG PO CP24
20.0000 mg | ORAL_CAPSULE | Freq: Every day | ORAL | 0 refills | Status: DC
  Filled 2023-11-02: qty 30, 30d supply, fill #0

## 2023-07-13 MED ORDER — AMPHETAMINE-DEXTROAMPHET ER 20 MG PO CP24
20.0000 mg | ORAL_CAPSULE | Freq: Every day | ORAL | 0 refills | Status: DC
  Filled 2023-09-16: qty 30, 30d supply, fill #0

## 2023-07-13 MED ORDER — AMPHETAMINE-DEXTROAMPHET ER 20 MG PO CP24
20.0000 mg | ORAL_CAPSULE | Freq: Every day | ORAL | 0 refills | Status: DC
  Filled 2023-08-02: qty 20, 20d supply, fill #0
  Filled 2023-08-03: qty 10, 10d supply, fill #0

## 2023-07-13 MED ORDER — METOPROLOL SUCCINATE ER 50 MG PO TB24
50.0000 mg | ORAL_TABLET | Freq: Every day | ORAL | 3 refills | Status: DC
  Filled 2023-07-13 – 2023-09-03 (×2): qty 90, 90d supply, fill #0
  Filled 2023-12-01: qty 90, 90d supply, fill #1
  Filled 2024-02-17: qty 90, 90d supply, fill #2

## 2023-07-13 NOTE — Progress Notes (Signed)
Subjective  CC:  Chief Complaint  Patient presents with   ADHD    HPI: Alison [name] is a 52 y.o. female who presents to the office today to address the problems listed above in the chief complaint.  ADD/ADHD  follow up:  She is taking medication as directed and continues to feel it is beneficial. The medications continue to help with focus and attention and task completion. She denies adverse side effects; specifically no headaches, appetite suppression, weight loss, sleeping difficulty, heart palpitations, chest pain or significant weight changes. Hypertension: Reports fairly good control at home.  She checked it this morning it was 120/70.  Occasionally diastolics will be a little bit higher and systolics could be as high as 138.  Never above 140/90.  Tolerating medications well.  No chest pain, palpitations or shortness of breath. Left thyroid nodule: Reviewed data again, had biopsy back in June 2022 which was inconclusive, sent for Afirma testing which showed benign genomic sequencing.  Has not had any further surveillance and follow-up since.  We referred her to endocrinology in January, she has an appointment in November.  She feels fine without signs or symptoms of hypo or hyperthyroidism.  No neck pain.  No obstructive or restrictive symptoms.  Assessment  1. Attention deficit hyperactivity disorder (ADHD), predominantly inattentive type   2. Essential hypertension   3. Left thyroid nodule      Plan  ADD:  Well-controlled on Adderall XR 20 daily.  Refilled for 6 months Hypertension remains well-controlled.  Continue current medications.  Continue home monitoring.  Discussed goal blood pressures of 120s over 70s.  Toprol-XL 50 daily Left thyroid nodule: Remains palpable, nontender.  No obstructive symptoms.  Last TSH was just above normal.  Patient defers rechecking it today.  She prefers to have it checked when she sees the endocrinologist.   Await endocrinology input. Health  maintenance: Patient reports she saw GYN in January.  Reports had Pap smear and mammogram at that time.  We called for records. Lab Results  Component Value Date   TSH 5.93 (H) 01/29/2023    I reviewed patient's records from the PMP aware controlled substance registry today.    Follow up: 6 months for complete physical No orders of the defined types were placed in this encounter.  Meds ordered this encounter  Medications   amphetamine-dextroamphetamine (ADDERALL XR) 20 MG 24 hr capsule    Sig: Take 1 capsule (20 mg total) by mouth daily.    Dispense:  30 capsule    Refill:  0   amphetamine-dextroamphetamine (ADDERALL XR) 20 MG 24 hr capsule    Sig: Take 1 capsule (20 mg total) by mouth daily.    Dispense:  30 capsule    Refill:  0   amphetamine-dextroamphetamine (ADDERALL XR) 20 MG 24 hr capsule    Sig: Take 1 capsule (20 mg total) by mouth daily.    Dispense:  30 capsule    Refill:  0   amphetamine-dextroamphetamine (ADDERALL XR) 20 MG 24 hr capsule    Sig: Take 1 capsule (20 mg total) by mouth daily.    Dispense:  30 capsule    Refill:  0   amphetamine-dextroamphetamine (ADDERALL XR) 20 MG 24 hr capsule    Sig: Take 1 capsule (20 mg total) by mouth daily.    Dispense:  30 capsule    Refill:  0   amphetamine-dextroamphetamine (ADDERALL XR) 20 MG 24 hr capsule    Sig: Take 1 capsule (  20 mg total) by mouth daily.    Dispense:  30 capsule    Refill:  0   metoprolol succinate (TOPROL-XL) 50 MG 24 hr tablet    Sig: Take 1 tablet (50 mg total) by mouth daily.    Dispense:  90 tablet    Refill:  3      I reviewed the patients updated PMH, FH, and SocHx.    Patient Active Problem List   Diagnosis Date Noted   Depression, major, single episode, mild (HCC) 05/14/2020    Priority: High   Attention deficit hyperactivity disorder 10/02/2019    Priority: High   Essential hypertension 05/09/2018    Priority: High   Left thyroid nodule 06/03/2021    Priority: Medium     Osteopenia 10/02/2019    Priority: Medium    DJD (degenerative joint disease), lumbar 05/09/2018    Priority: Medium    Urinary, incontinence, stress female 05/14/2021    Priority: Low   Seasonal allergic rhinitis due to pollen 05/14/2021    Priority: Low   Genital warts 10/02/2019    Priority: Low   Goiter 06/20/2019    Priority: Low   Current Meds  Medication Sig   [DISCONTINUED] amphetamine-dextroamphetamine (ADDERALL XR) 20 MG 24 hr capsule Take 1 capsule (20 mg total) by mouth daily.   [DISCONTINUED] metoprolol succinate (TOPROL-XL) 50 MG 24 hr tablet Take 1 tablet (50 mg total) by mouth daily.    Allergies: Patient has No Known Allergies. Family History: Patient family history includes Anxiety disorder in her sister; Atrial fibrillation in her father; COPD in her father; Fibroids in her sister; Goiter in her mother; Healthy in her daughter; Heart disease in her father, maternal uncle, and mother; Hypertension in her father, mother, and sister; Lymphoma in her father; Stroke in her mother; Sudden Cardiac Death in her mother. Social History:  Patient  reports that she has quit smoking. Her smoking use included cigarettes. She has never used smokeless tobacco. She reports that she does not drink alcohol and does not use drugs.  Review of Systems: Constitutional: Negative for fever malaise or anorexia Cardiovascular: negative for chest pain Respiratory: negative for SOB or persistent cough Gastrointestinal: negative for abdominal pain  Objective  Vitals: BP 120/70 Comment: This morning by home reading  Pulse 65   Temp 97.6 F (36.4 C)   Ht 5\' 5"  (1.651 m)   Wt 162 lb 6.4 oz (73.7 kg)   SpO2 97%   BMI 27.02 kg/m  General: no acute distress , A&Ox3 Neck: Enlarged thyroid left greater than right, palpable nodule, nontender Cardiovascular:  RRR without murmur or gallop.     Commons side effects, risks, benefits, and alternatives for medications and treatment plan  prescribed today were discussed, and the patient expressed understanding of the given instructions. Patient is instructed to call or message via MyChart if he/she has any questions or concerns regarding our treatment plan. No barriers to understanding were identified. We discussed Red Flag symptoms and signs in detail. Patient expressed understanding regarding what to do in case of urgent or emergency type symptoms.  Medication list was reconciled, printed and provided to the patient in AVS. Patient instructions and summary information was reviewed with the patient as documented in the AVS. This note was prepared with assistance of Dragon voice recognition software. Occasional wrong-word or sound-a-like substitutions may have occurred due to the inherent limitations of voice recognition software

## 2023-07-14 ENCOUNTER — Other Ambulatory Visit (HOSPITAL_COMMUNITY): Payer: Self-pay

## 2023-08-02 ENCOUNTER — Other Ambulatory Visit (HOSPITAL_COMMUNITY): Payer: Self-pay

## 2023-08-03 ENCOUNTER — Other Ambulatory Visit (HOSPITAL_COMMUNITY): Payer: Self-pay

## 2023-08-13 ENCOUNTER — Telehealth: Payer: Commercial Managed Care - PPO | Admitting: Nurse Practitioner

## 2023-08-13 DIAGNOSIS — L237 Allergic contact dermatitis due to plants, except food: Secondary | ICD-10-CM | POA: Diagnosis not present

## 2023-08-13 MED ORDER — PREDNISONE 10 MG PO TABS: ORAL_TABLET | ORAL | 0 refills | Status: DC

## 2023-08-13 NOTE — Progress Notes (Signed)
Virtual Visit Consent   SYDNYE HANFT, you are scheduled for a virtual visit with a [facility] provider today. Just as with appointments in the office, your consent must be obtained to participate. Your consent will be active for this visit and any virtual visit you may have with one of our providers in the next 365 days. If you have a MyChart account, a copy of this consent can be sent to you electronically.  As this is a virtual visit, video technology does not allow for your provider to perform a traditional examination. This may limit your provider's ability to fully assess your condition. If your provider identifies any concerns that need to be evaluated in person or the need to arrange testing (such as labs, EKG, etc.), we will make arrangements to do so. Although advances in technology are sophisticated, we cannot ensure that it will always work on either your end or our end. If the connection with a video visit is poor, the visit may have to be switched to a telephone visit. With either a video or telephone visit, we are not always able to ensure that we have a secure connection.  By engaging in this virtual visit, you consent to the provision of healthcare and authorize for your insurance to be billed (if applicable) for the services provided during this visit. Depending on your insurance coverage, you may receive a charge related to this service.  I need to obtain your verbal consent now. Are you willing to proceed with your visit today? Alison [name] has provided verbal consent on 08/13/2023 for a virtual visit (video or telephone). Viviano Simas, FNP  Date: 08/13/2023 9:03 AM  Virtual Visit via Video Note   I, Viviano Simas, connected with  Alison [name]  ([id number], 01-23-1971) on 08/13/23 at  9:15 AM EDT by a video-enabled telemedicine application and verified that I am speaking with the correct person using two identifiers.  Location: Patient: Virtual Visit Location Patient:  Home Provider: Virtual Visit Location Provider: Home Office   I discussed the limitations of evaluation and management by telemedicine and the availability of in person appointments. The patient expressed understanding and agreed to proceed.    History of Present Illness: Alison [name] is a 52 y.o. who identifies as a female who was assigned female at birth, and is being seen today for poison Ivy.   She was exposed 2 days ago when she was working out in her yard  Started on her face and has spread to chin, neck and expanding towards her eyes and into her ears   Has had this happen in the past and required prednisone for relief   She has been taking Benadryl and using triamcinolone for relief   Most recent episode was approximately 2 years ago   Rash is itching, no openings or signs of infection   No fever   Problems:  Patient Active Problem List   Diagnosis Date Noted   Left thyroid nodule 06/03/2021   Urinary, incontinence, stress female 05/14/2021   Seasonal allergic rhinitis due to pollen 05/14/2021   Depression, major, single episode, mild (HCC) 05/14/2020   Attention deficit hyperactivity disorder 10/02/2019   Genital warts 10/02/2019   Osteopenia 10/02/2019   Goiter 06/20/2019   Essential hypertension 05/09/2018   DJD (degenerative joint disease), lumbar 05/09/2018    Allergies: No Known Allergies Medications:  Current Outpatient Medications:    amphetamine-dextroamphetamine (ADDERALL XR) 20 MG 24 hr capsule, Take 1 capsule (20 mg  total) by mouth daily., Disp: 30 capsule, Rfl: 0   [START ON 08/31/2023] amphetamine-dextroamphetamine (ADDERALL XR) 20 MG 24 hr capsule, Take 1 capsule (20 mg total) by mouth daily., Disp: 30 capsule, Rfl: 0   [START ON 09/30/2023] amphetamine-dextroamphetamine (ADDERALL XR) 20 MG 24 hr capsule, Take 1 capsule (20 mg total) by mouth daily., Disp: 30 capsule, Rfl: 0   [START ON 10/31/2023] amphetamine-dextroamphetamine (ADDERALL XR) 20 MG 24  hr capsule, Take 1 capsule (20 mg total) by mouth daily., Disp: 30 capsule, Rfl: 0   [START ON 11/30/2023] amphetamine-dextroamphetamine (ADDERALL XR) 20 MG 24 hr capsule, Take 1 capsule (20 mg total) by mouth daily., Disp: 30 capsule, Rfl: 0   [START ON 12/31/2023] amphetamine-dextroamphetamine (ADDERALL XR) 20 MG 24 hr capsule, Take 1 capsule (20 mg total) by mouth daily., Disp: 30 capsule, Rfl: 0   metoprolol succinate (TOPROL-XL) 50 MG 24 hr tablet, Take 1 tablet (50 mg total) by mouth daily., Disp: 90 tablet, Rfl: 3  Observations/Objective: Patient is well-developed, well-nourished in no acute distress.  Resting comfortably  at home.  Head is normocephalic, atraumatic.  No labored breathing.  Speech is clear and coherent with logical content.  Patient is alert and oriented at baseline.    Assessment and Plan:  1. Poison ivy dermatitis  May use benadryl at night for relief from itching  Also may use benadryl topical as well for added relief   - predniSONE (DELTASONE) 10 MG tablet; Take 4 tablets (40mg ) on days 1-4, then 3 tablets (30mg ) on days 5-8, then 2 tablets (20mg ) on days 9-11, then 1 tablet daily for days 12-14. Take with food.  Dispense: 37 tablet; Refill: 0    Follow up with any change in vision or new/worsening symptoms   Follow Up Instructions: I discussed the assessment and treatment plan with the patient. The patient was provided an opportunity to ask questions and all were answered. The patient agreed with the plan and demonstrated an understanding of the instructions.  A copy of instructions were sent to the patient via MyChart unless otherwise noted below.    The patient was advised to call back or seek an in-person evaluation if the symptoms worsen or if the condition fails to improve as anticipated.  Time:  I spent 10 minutes with the patient via telehealth technology discussing the above problems/concerns.    Viviano Simas, FNP

## 2023-09-03 ENCOUNTER — Other Ambulatory Visit (HOSPITAL_COMMUNITY): Payer: Self-pay

## 2023-09-16 ENCOUNTER — Other Ambulatory Visit (HOSPITAL_COMMUNITY): Payer: Self-pay

## 2023-11-02 ENCOUNTER — Other Ambulatory Visit (HOSPITAL_COMMUNITY): Payer: Self-pay

## 2023-11-03 ENCOUNTER — Other Ambulatory Visit (HOSPITAL_COMMUNITY): Payer: Self-pay

## 2023-11-04 ENCOUNTER — Other Ambulatory Visit: Payer: Self-pay

## 2023-11-04 ENCOUNTER — Other Ambulatory Visit (HOSPITAL_COMMUNITY): Payer: Self-pay

## 2023-11-05 ENCOUNTER — Other Ambulatory Visit (HOSPITAL_COMMUNITY): Payer: Self-pay

## 2023-11-05 ENCOUNTER — Ambulatory Visit: Payer: Commercial Managed Care - PPO | Admitting: Internal Medicine

## 2023-11-05 ENCOUNTER — Encounter: Payer: Self-pay | Admitting: Internal Medicine

## 2023-11-05 VITALS — BP 120/80 | HR 65 | Ht 65.0 in | Wt 160.0 lb

## 2023-11-05 DIAGNOSIS — E042 Nontoxic multinodular goiter: Secondary | ICD-10-CM

## 2023-11-05 LAB — TSH: TSH: 2.96 u[IU]/mL (ref 0.35–5.50)

## 2023-11-05 LAB — T4, FREE: Free T4: 0.71 ng/dL (ref 0.60–1.60)

## 2023-11-05 LAB — T3, FREE: T3, Free: 3.9 pg/mL (ref 2.3–4.2)

## 2023-11-05 NOTE — Progress Notes (Unsigned)
Name: Alison [name]  MRN/ DOB: [id number], 04-01-1971    Age/ Sex: 52 y.o., female    PCP: Willow Ora, MD   Reason for Endocrinology Evaluation: MNG     Date of Initial Endocrinology Evaluation: 11/05/2023     HPI: Alison [name] is a 52 y.o. female with a past medical history of MNG and ADD . The patient presented for initial endocrinology clinic visit on 11/05/2023 for consultative assistance with her MNG.   Patient was diagnosed with multinodular goiter based on thyroid ultrasound 05/2021, a left 3 cm nodule met criteria for biopsy.  She is s/p FNA of the left 3 cm nodule 05/2021 with cytology report consistent of follicular lesion of undetermined significance (Bethesda Category III), Afirma benign  Has noted local neck swelling  Denies local pain or dysphagia  Has noted weight loss  Has occasional chronic palpitations  Denies tremors  No changes in bowel movement  NO biotin  No Kelp  LMP 12/2022 - removed IUD  Has noted occasional hot flashes, fatigue and excessive sweating - follows with Gyn  No hair loss but has noted thinning of the hair   Mother and paternal grandmother with thyroid disease  No Fh of breast/ ovarian cancer     HISTORY:  Past Medical History:  Past Medical History:  Diagnosis Date   Adult ADHD    Closed left hip fracture (HCC) 03/21/2022   Degeneration of intervertebral disc of lumbar region    Hypertension    Past Surgical History:  Past Surgical History:  Procedure Laterality Date   INTRAMEDULLARY (IM) NAIL INTERTROCHANTERIC Left 03/22/2022   Procedure: INTRAMEDULLARY (IM) NAIL, HIP;  Surgeon: Terance Hart, MD;  Location: [facility] OR;  Service: Orthopedics;  Laterality: Left;   LUMBAR DISC SURGERY  2010   Dr. Newell [place], [place]   TONSILLECTOMY      Social History:  reports that she has quit smoking. Her smoking use included cigarettes. She has never used smokeless tobacco. She reports that she does not drink alcohol and does not  use drugs. Family History: family history includes Anxiety disorder in her sister; Atrial fibrillation in her father; COPD in her father; Fibroids in her sister; Goiter in her mother; Healthy in her daughter; Heart disease in her father, maternal uncle, and mother; Hypertension in her father, mother, and sister; Lymphoma in her father; Stroke in her mother; Sudden Cardiac Death in her mother.   HOME MEDICATIONS: Allergies as of 11/05/2023   No Known Allergies      Medication List        Accurate as of November 05, 2023  1:02 PM. If you have any questions, ask your nurse or doctor.          STOP taking these medications    escitalopram 10 MG tablet Commonly known as: LEXAPRO Stopped by: Johnney Ou Horacio Werth   hydrochlorothiazide 12.5 MG capsule Commonly known as: MICROZIDE Stopped by: Johnney Ou Kimi Bordeau   predniSONE 10 MG tablet Commonly known as: DELTASONE Stopped by: Johnney Ou Anjelo Pullman       TAKE these medications    amphetamine-dextroamphetamine 20 MG 24 hr capsule Commonly known as: ADDERALL XR Take 1 capsule by mouth daily.   amphetamine-dextroamphetamine 20 MG 24 hr capsule Commonly known as: ADDERALL XR Take 1 capsule (20 mg total) by mouth daily.   amphetamine-dextroamphetamine 20 MG 24 hr capsule Commonly known as: ADDERALL XR Take 1 capsule (20 mg total) by mouth daily.   amphetamine-dextroamphetamine  20 MG 24 hr capsule Commonly known as: ADDERALL XR Take 1 capsule (20 mg total) by mouth daily.   amphetamine-dextroamphetamine 20 MG 24 hr capsule Commonly known as: ADDERALL XR Take 1 capsule (20 mg total) by mouth daily.   amphetamine-dextroamphetamine 20 MG 24 hr capsule Commonly known as: ADDERALL XR Take 1 capsule (20 mg total) by mouth daily. Start taking on: November 30, 2023   amphetamine-dextroamphetamine 20 MG 24 hr capsule Commonly known as: ADDERALL XR Take 1 capsule (20 mg total) by mouth daily. Start taking on: December 31, 2023    metoprolol succinate 50 MG 24 hr tablet Commonly known as: TOPROL-XL Take 1 tablet by mouth daily.   metoprolol succinate 50 MG 24 hr tablet Commonly known as: TOPROL-XL Take 1 tablet (50 mg total) by mouth daily.          REVIEW OF SYSTEMS: A comprehensive ROS was conducted with the patient and is negative except as per HPI   OBJECTIVE:  VS: BP 120/80 (BP Location: Left Arm, Patient Position: Sitting, Cuff Size: Small)   Pulse 65   Ht 5\' 5"  (1.651 m)   Wt 160 lb (72.6 kg)   SpO2 99%   BMI 26.63 kg/m    Wt Readings from Last 3 Encounters:  11/05/23 160 lb (72.6 kg)  07/13/23 162 lb 6.4 oz (73.7 kg)  01/06/23 169 lb 12.8 oz (77 kg)     EXAM: General: Pt appears well and is in NAD  Neck: General: Supple without adenopathy. Thyroid: Thyroid size normal.  Left isthmic nodule appreciated   Lungs: Clear with good BS bilat   Heart: Auscultation: RRR.  Abdomen: Soft, nontender  Extremities:  BL LE: No pretibial edema   Mental Status: Judgment, insight: Intact Orientation: Oriented to time, place, and person Mood and affect: No depression, anxiety, or agitation     DATA REVIEWED: ***  01/29/2023  TSH 5.93 uIU/mL   Thyroid Ultrasound 06/05/2021    Estimated total number of nodules >/= 1 cm: 3   Number of spongiform nodules >/=  2 cm not described below (TR1): 0   Number of mixed cystic and solid nodules >/= 1.5 cm not described below (TR2): 0   _________________________________________________________   Markedly heterogeneous and lobular thyroid gland. There are multiple areas of isoechoic pseudo nodularity throughout the gland.   Nodule # 2: A 1.3 cm solid echogenic nodule in the left mid gland does not meet criteria for further evaluation.   Nodule # 3:   Location: Left; Inferior   Maximum size: 3.0 cm; Other 2 dimensions: 1.9 x 2.1 cm   Composition: solid/almost completely solid (2)   Echogenicity: isoechoic (1)   Shape: not taller-than-wide  (0)   Margins: ill-defined (0)   Echogenic foci: none (0)   ACR TI-RADS total points: 3.   ACR TI-RADS risk category: TR3 (3 points).   ACR TI-RADS recommendations:   **Given size (>/= 2.5 cm) and appearance, fine needle aspiration of this mildly suspicious nodule should be considered based on TI-RADS criteria.   _________________________________________________________   IMPRESSION: 1. Diffusely heterogeneous, lobular thyroid gland. Multiple areas of pseudo nodularity and small nodules which do not warrant further evaluation. 2. Solitary 3 cm TI-RADS category 3 nodule versus pseudo nodule in the left mid to lower gland meets criteria for biopsy.  FNA left nodule 06/05/2021    Clinical History: 3.0 cm LLL  Specimen Submitted:  A. THYROID, LEFT, FINE NEEDLE ASPIRATION:    FINAL MICROSCOPIC DIAGNOSIS:  -  Follicular lesion of undetermined significance (Bethesda category III)   AFIRMA Benign   ASSESSMENT/PLAN/RECOMMENDATIONS:   MNG:  -Pt has been noted with local neck enlargement  - S/P FNA of the left 3 cm nodule in 2022 with follicular lesion of unknown significance ( Bethesda Category III) , Afirma benign    2.Subclinical Hypothyroidism:  -TSH was elevated 5.930 uIU/mL in 01/2023  Medications :  Signed electronically by: Lyndle Herrlich, MD  [facility] Endocr[facility]  [facility] [address] [place] [place], [place] [address] Phone: [phone] FAX: [phone]   CC: Willow Ora, MD [address] [place] [address] Phone: [phone] Fax: [phone]   Return to Endocrinology clinic as below: Future Appointments  Date Time Provider Department Center  01/13/2024 10:00 AM Willow Ora, MD [facility]

## 2023-11-08 ENCOUNTER — Ambulatory Visit
Admission: RE | Admit: 2023-11-08 | Discharge: 2023-11-08 | Disposition: A | Discharge status: Home / Self Care | Payer: Commercial Managed Care - PPO | Source: Ambulatory Visit | Attending: Internal Medicine | Admitting: Internal Medicine

## 2023-11-08 DIAGNOSIS — E042 Nontoxic multinodular goiter: Secondary | ICD-10-CM | POA: Diagnosis not present

## 2023-11-08 LAB — THYROID PEROXIDASE ANTIBODY: Thyroperoxidase Ab SerPl-aCnc: 43 [IU]/mL — ABNORMAL HIGH (ref <9)

## 2023-12-13 IMAGING — CR DG FEMUR 2+V*L*
4 series · 4 of 4 positions shown · non-contrast
Comparison: Same day radiographs of the left hip

CLINICAL DATA: Fall from horse, hip fracture

EXAM:
LEFT FEMUR 2 VIEWS

[femur ap (1 of 2)]
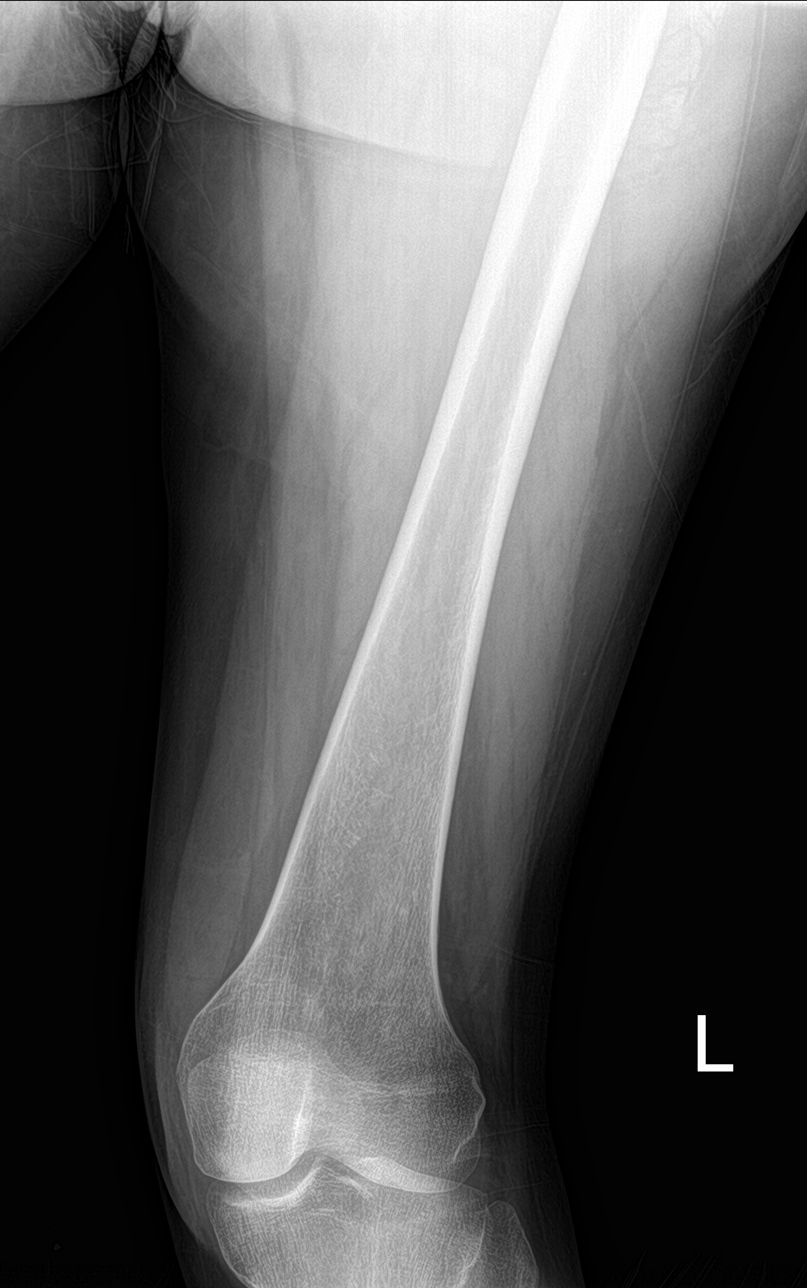

[femur ap (2 of 2)]
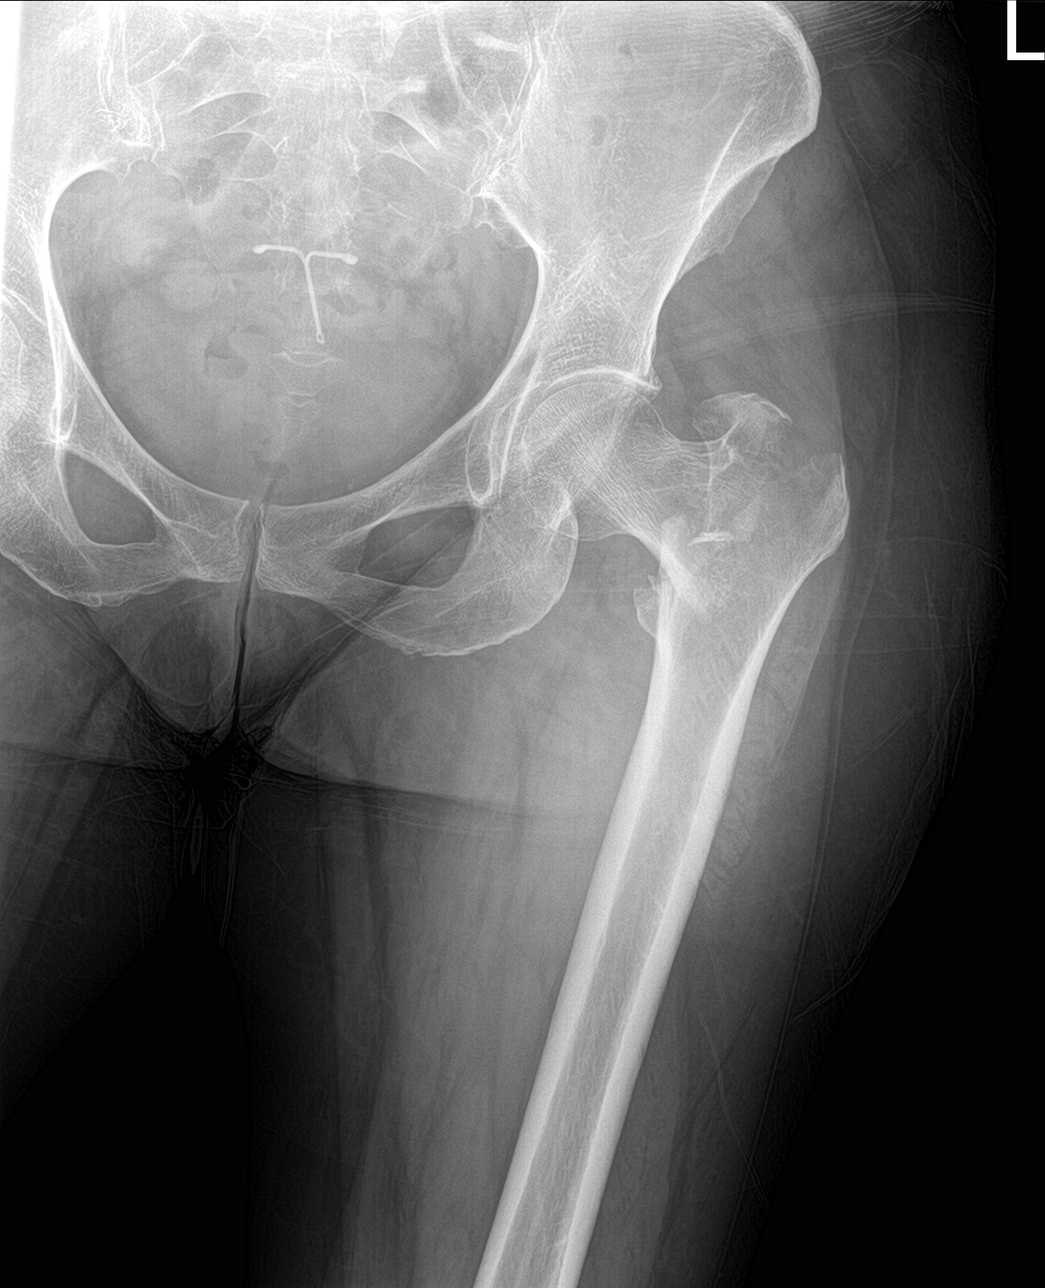

[femur lat (1 of 2)]
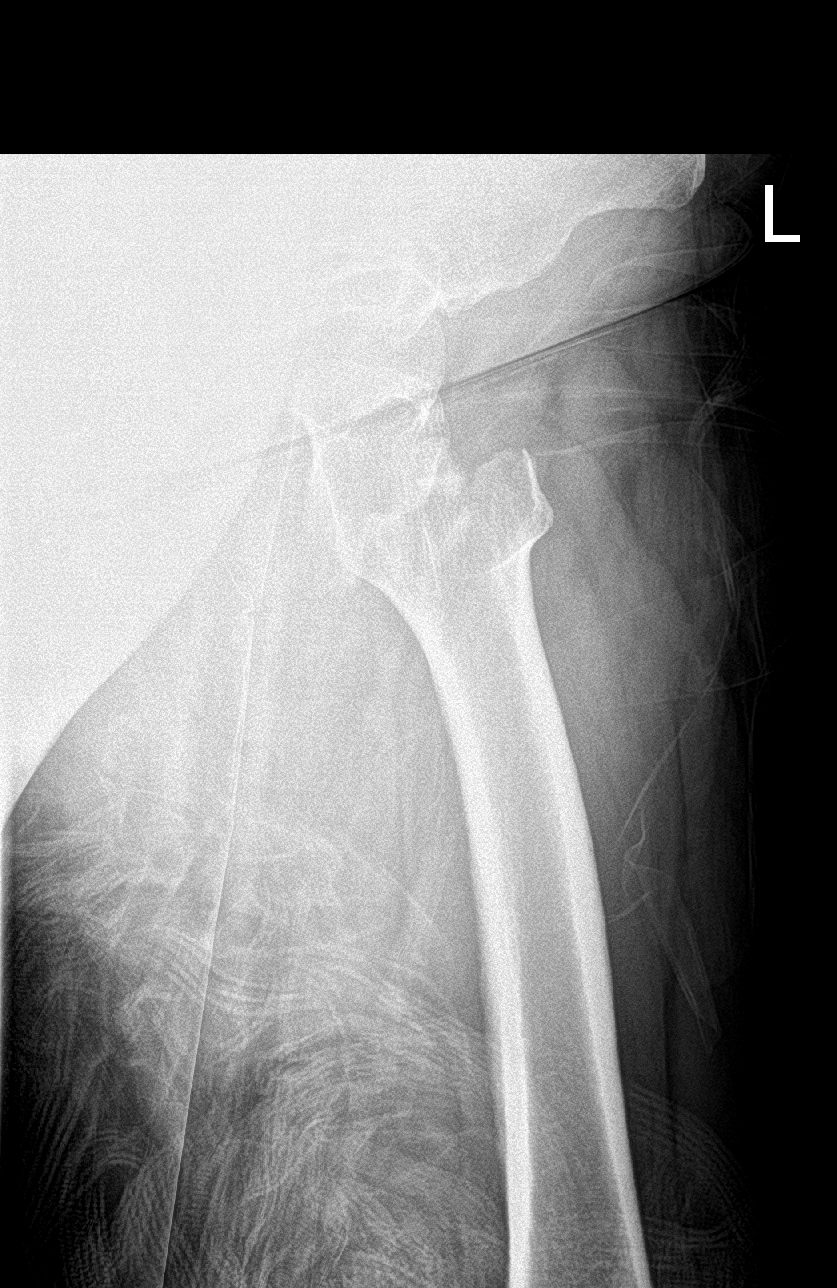

[femur lat (2 of 2)]
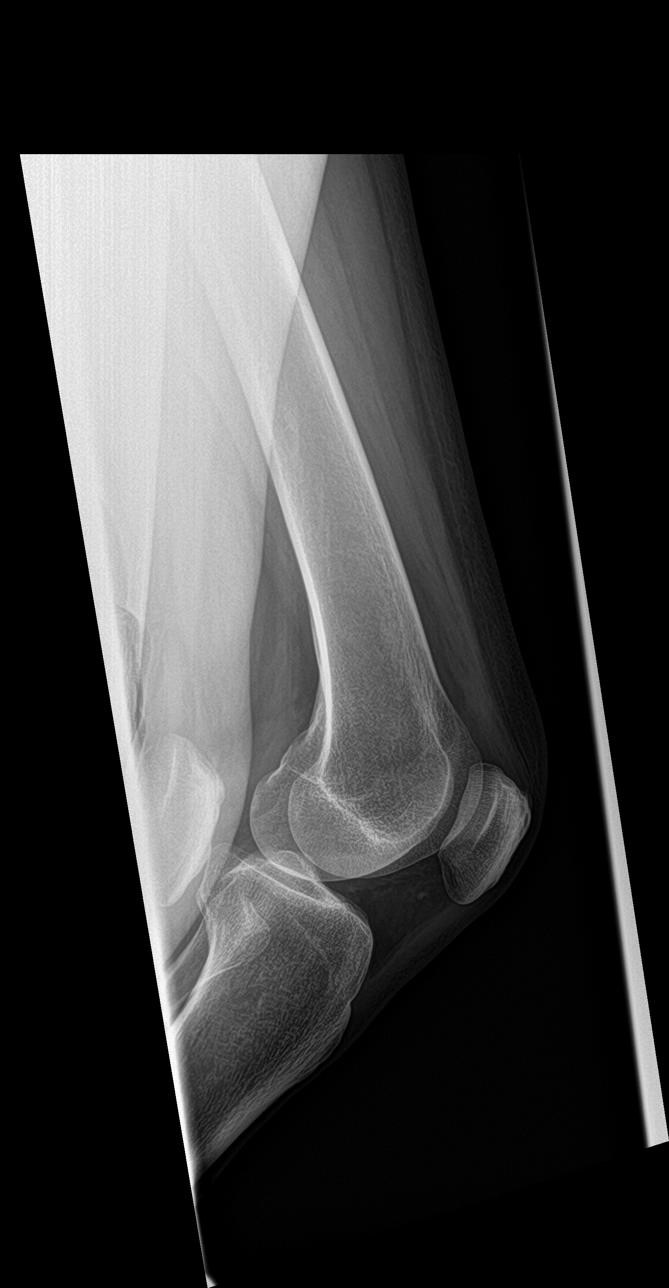

[4 of 4 positions shown; findings below may reference images not displayed]

FINDINGS: Angulated, comminuted inter trochanteric fractures of the left hip.
No fracture or dislocation of the distal left femur. Soft tissues
are unremarkable.
IMPRESSION: 1. Angulated, comminuted intertrochanteric fractures of the left
hip.
2. No fracture or dislocation of the distal left femur.

## 2023-12-13 IMAGING — CT CT HEAD W/O CM
3 series · 14 of 47 positions shown, 16 images · non-contrast
Comparison: None.

CLINICAL DATA: Head and neck trauma.  Fell from a horse today.



[Series 4: head 5.0 h30s · axial · 0.42mm/px · z∈[-104,+41]mm · 8 of 35 slices shown, 10 images]
[im 3/35  brain]
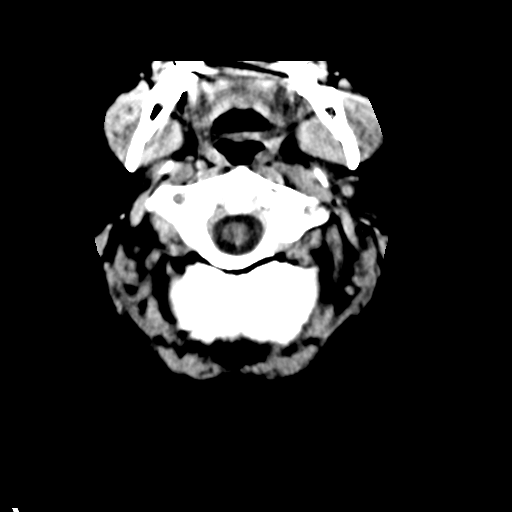
[im 3/35  bone]
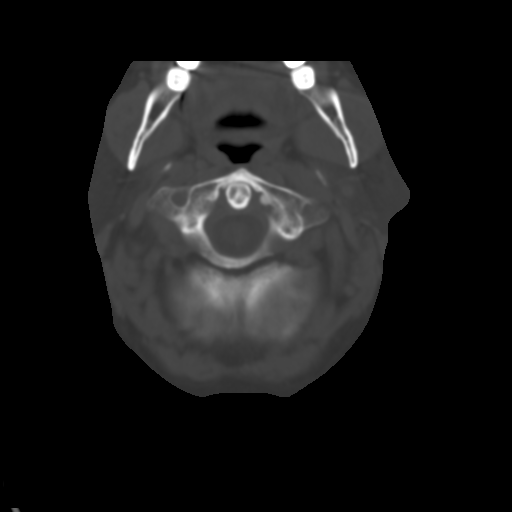
[im 8/35  brain]
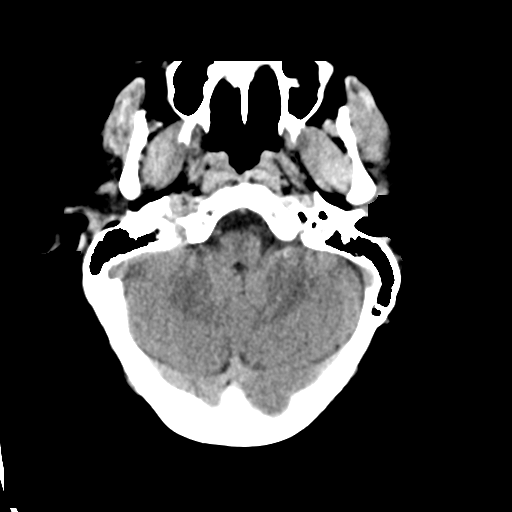
[im 11/35  brain]
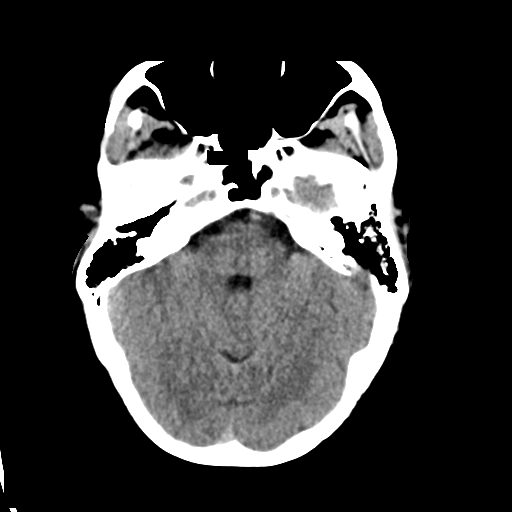
[im 16/35  brain]
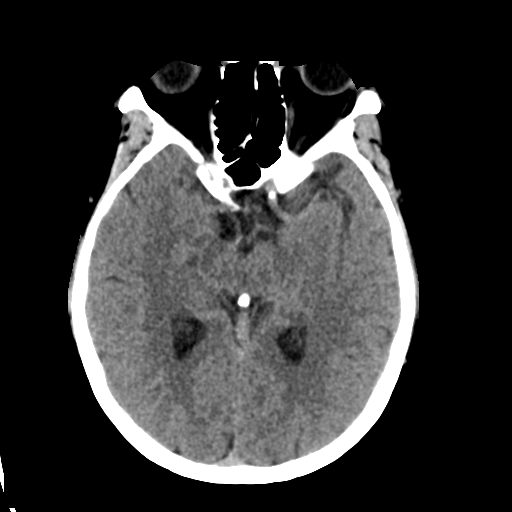
[im 19/35  brain]
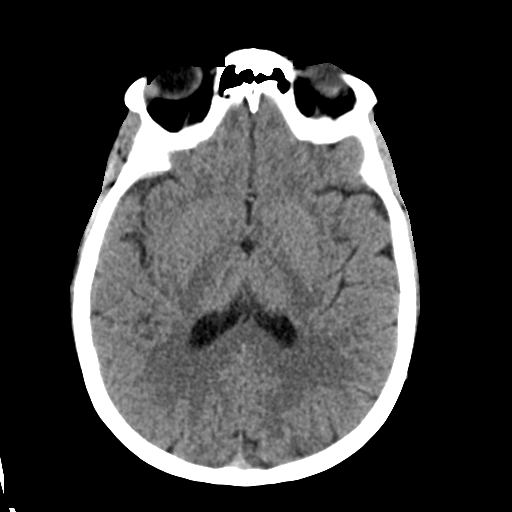
[im 19/35  bone]
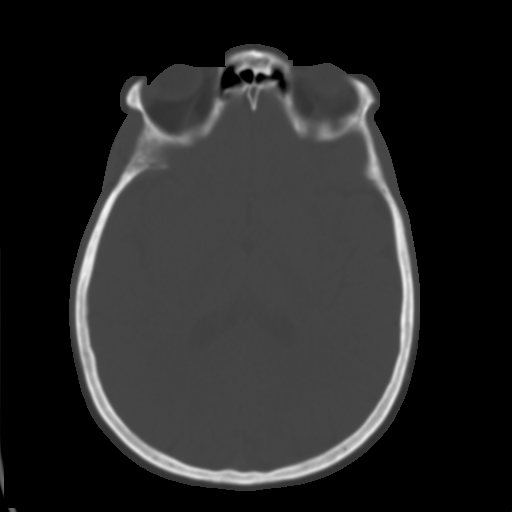
[im 24/35  brain]
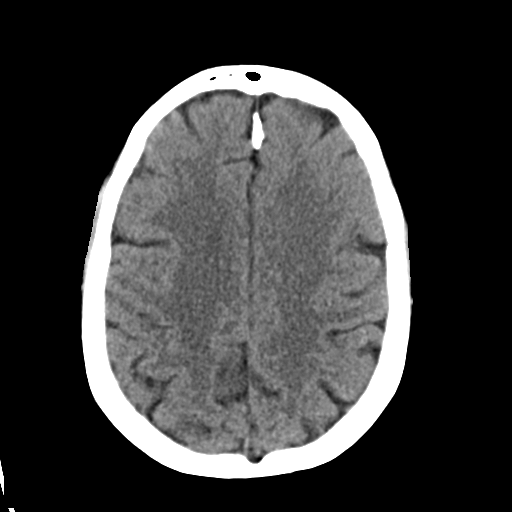
[im 27/35  brain]
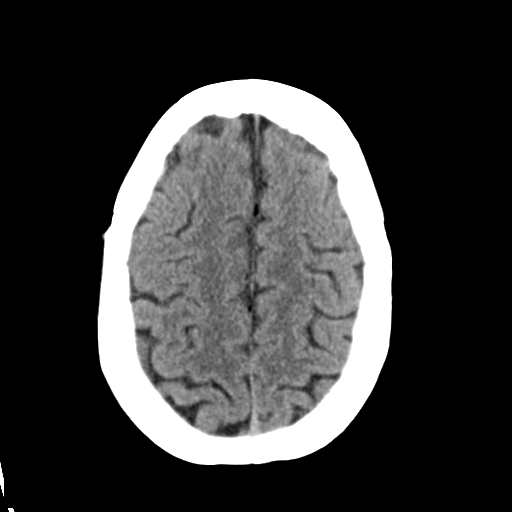
[im 32/35  brain]
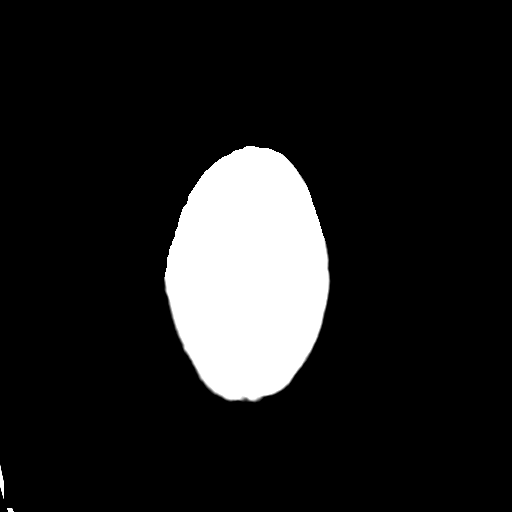

[Series 5: head 3.0 mpr cor · coronal · 0.34mm/px · 3 of 68 slices shown]
[im 25/68  brain]
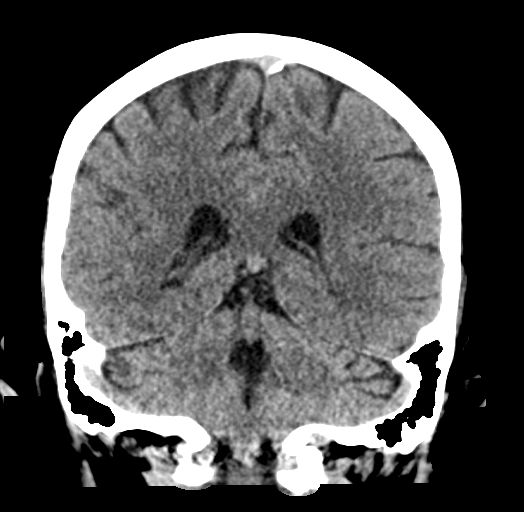
[im 31/68  brain]
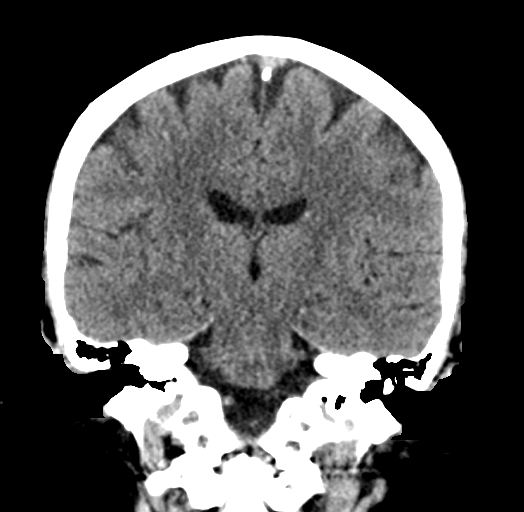
[im 37/68  brain]
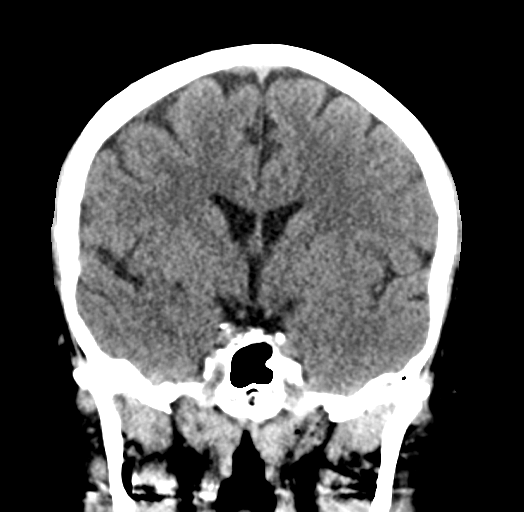

[Series 6: head 3.0 mpr sag · sagittal · 0.35mm/px · 3 of 59 slices shown]
[im 20/59  brain]
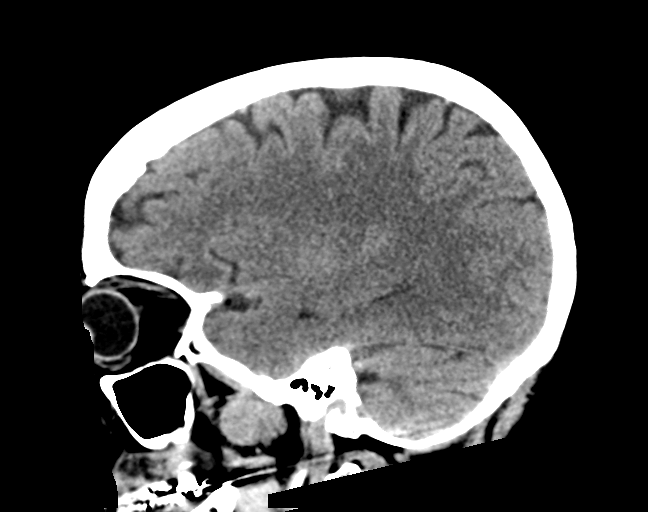
[im 30/59  brain]
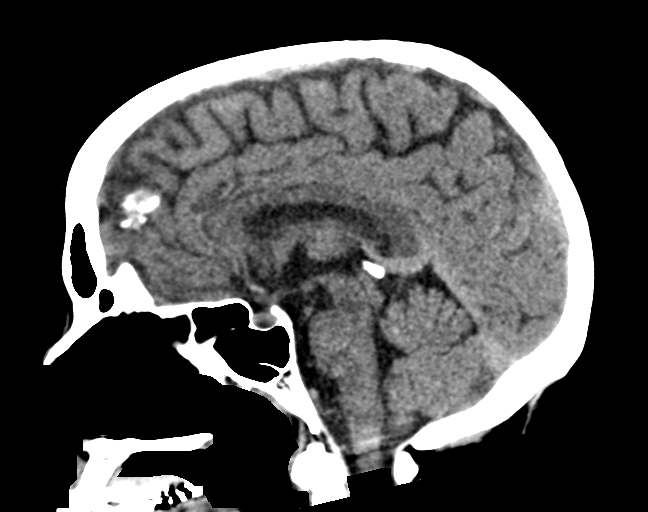
[im 39/59  brain]
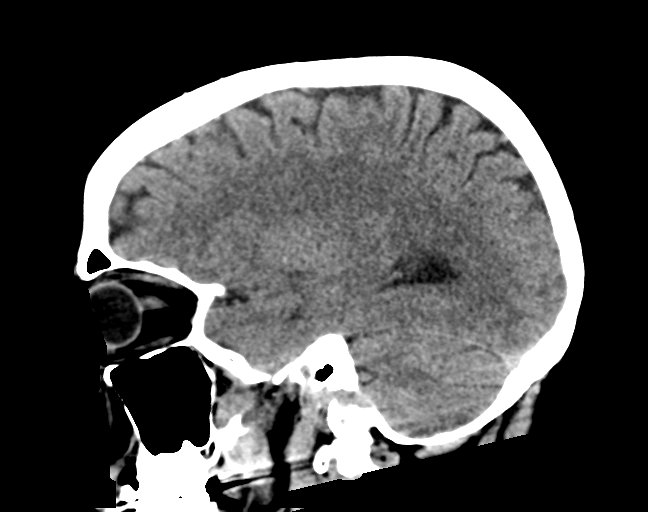

[14 of 47 positions shown; findings below may reference images not displayed]

FINDINGS: CT HEAD FINDINGS

Brain: No evidence of acute infarction, hemorrhage, hydrocephalus,
extra-axial collection or mass lesion/mass effect. There are
dystrophic benign dural calcifications in the frontal and superior
falx.

Vascular: No hyperdense vessel or unexpected calcification.

Skull: The calvarium, skull base and orbits are intact without focal
calvarial lesions. There is no visible scalp hematoma.

Sinuses/Orbits: There is mild membrane thickening and small
retention cysts in the maxillary and sphenoid sinuses, mild membrane
thickening in the ethmoids. The frontal sinus and mastoid air cells
are clear. Unremarkable orbital contents.

Other: None.

CT CERVICAL SPINE FINDINGS

Alignment: Normal.

Skull base and vertebrae: The bone mineralization within normal
limits. No fracture is evident. The vertebra are normal in heights.
No focal osteolytic lesion is seen or sclerotic process.

Soft tissues and spinal canal: No prevertebral fluid or swelling. No
visible canal hematoma. There is a 2.7 cm mass of the inferior pole
left lobe of the thyroid gland, which underwent FNA biopsy
06/05/2021 with unknown results. Gross size is unchanged. Please
correlate with pathologic results. No cervical adenopathy is seen.
Parotid glands are fatty replaced but symmetric.

Disc levels: There is preservation of the normal cervical disc
heights. There are small anterior endplate spurs C3-4 through C6-7,
at all of these levels with mild posterior disc osteophyte complex
formation eccentric to the left causing mild effacement of the
ventral CSF without mass effect on the cord.

Narrowing and spurring is noted of the anterior atlantodental joint.
There are no herniated discs or appreciable cord compromise. Mild
uncinate joint and facet spurring is seen with foraminal stenosis
which is moderate on the left at C3-4, without further cervical
foraminal compromise.

Upper chest: There is no apical pneumothorax.

Other: None.
IMPRESSION: 1. No acute intracranial CT findings or depressed skull fractures.
2. Sinus disease.
3. No evidence of cervical fractures or malalignment.
4. Early degenerative features.
5. 2.7 cm left lower pole thyroid mass, with FNA biopsy performed
06/05/2021 with unknown results. Correlate with pathologic results.
There is no change in the gross size.

## 2023-12-14 IMAGING — RF DG FEMUR 2+V*L*
1 series · 6 of 6 positions shown · non-contrast
Comparison: 03/21/2022

CLINICAL DATA: Portable imaging for left femur fracture ORIF.

EXAM:
OPERATIVE LEFT HIP (WITH PELVIS IF PERFORMED) 6 VIEWS
TECHNIQUE: Fluoroscopic spot image(s) were submitted for interpretation
post-operatively.

[Series 1: run · 6 of 6 slices shown]
[im 1/6]
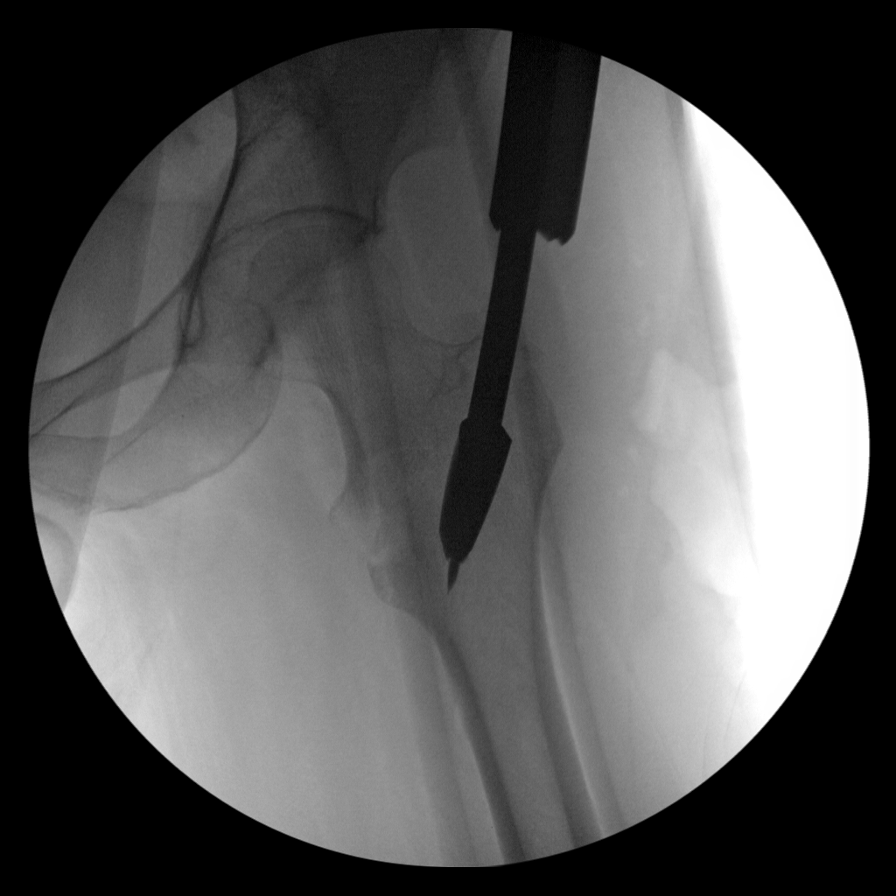
[im 2/6]
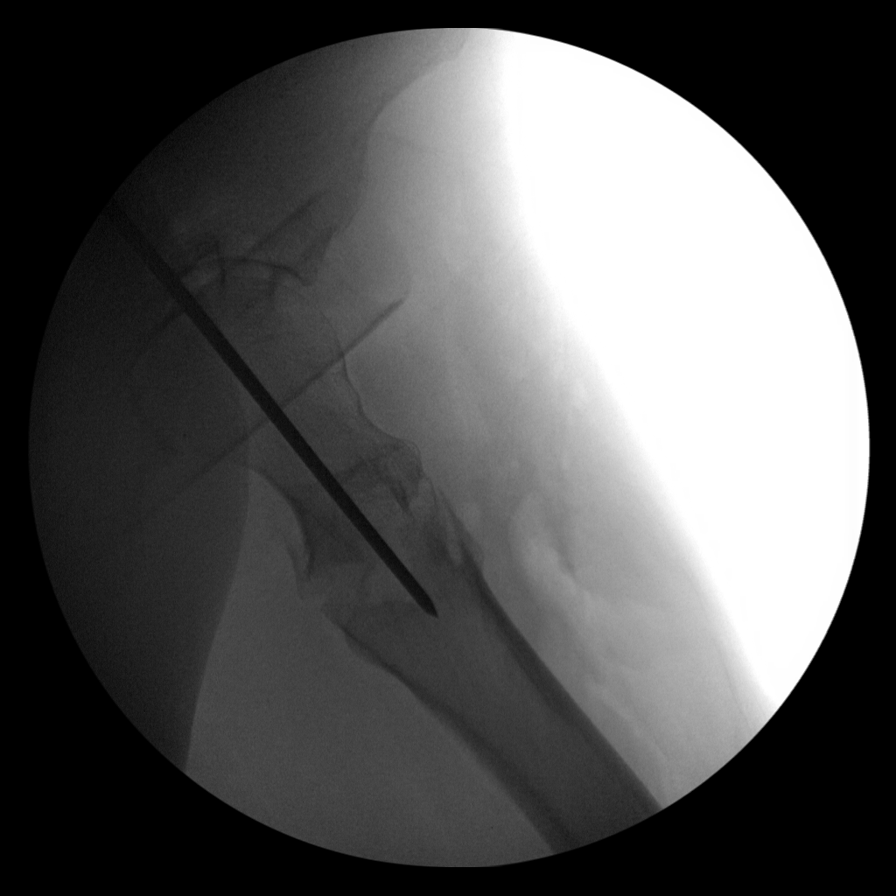
[im 3/6]
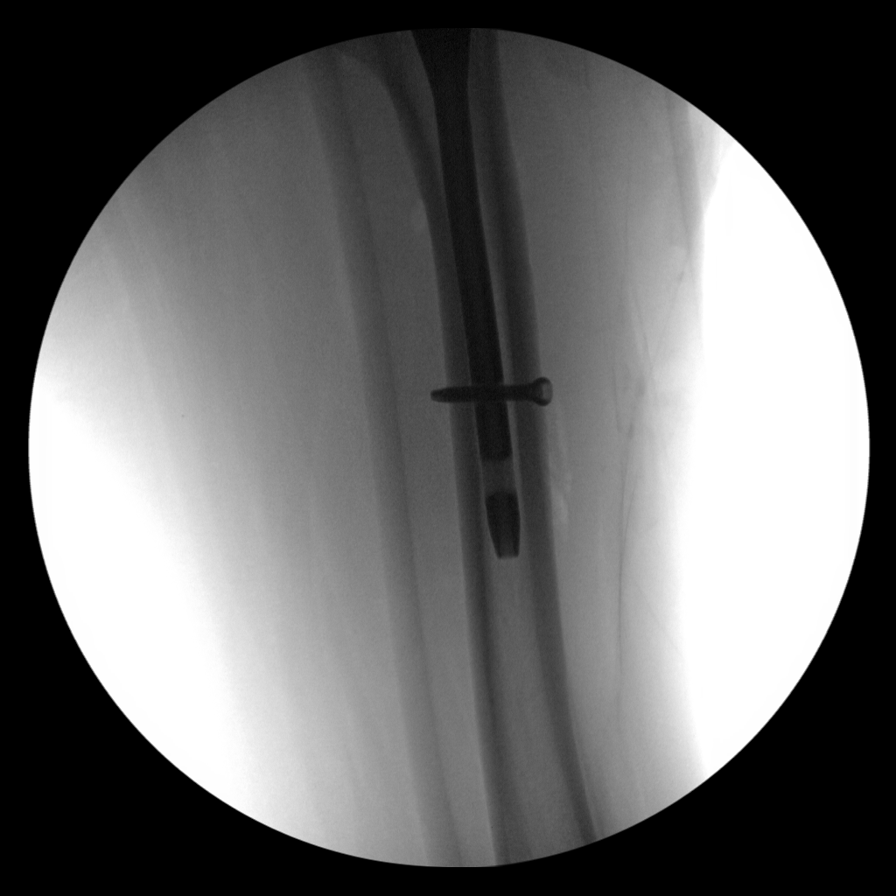
[im 4/6]
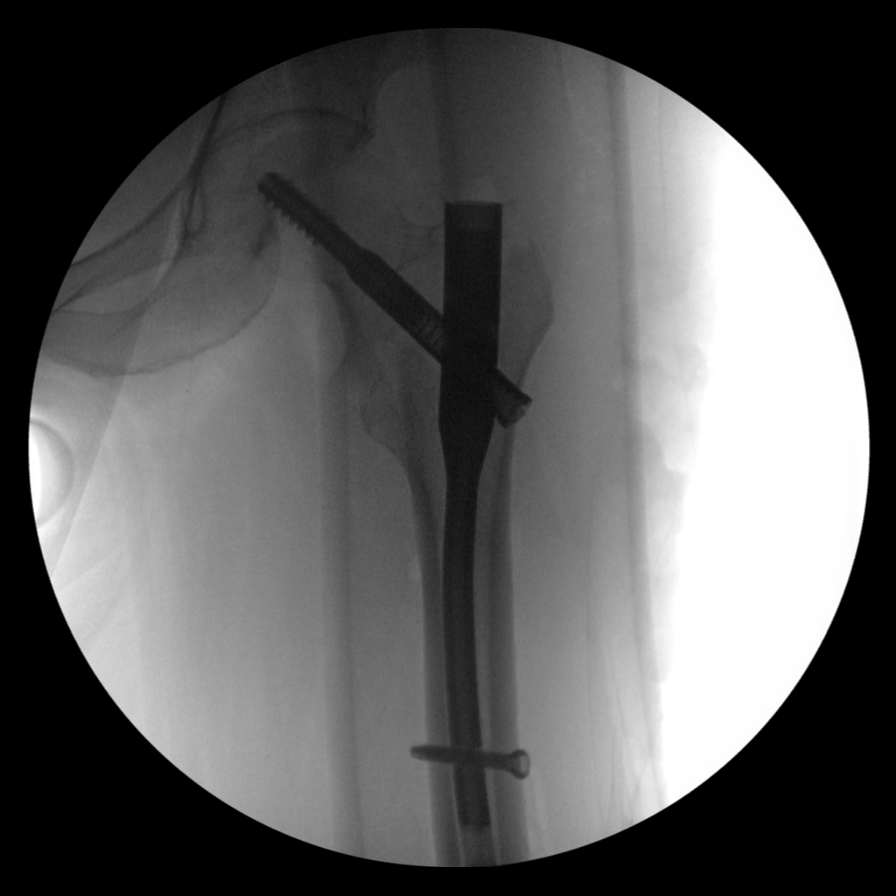
[im 5/6]
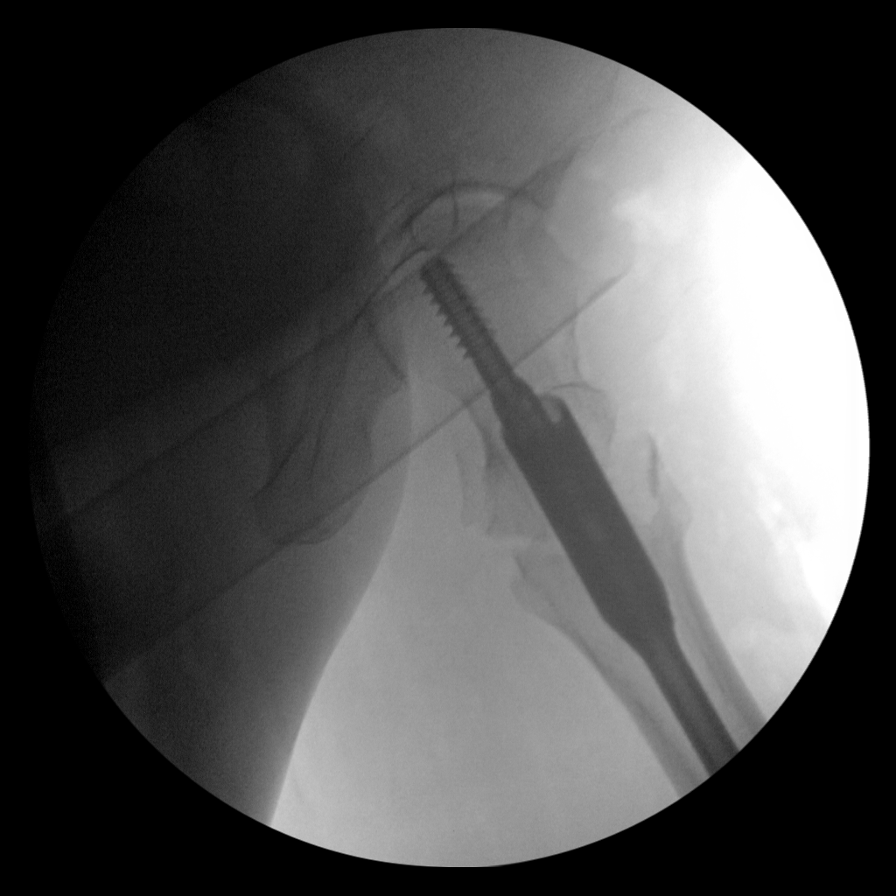
[im 6/6]
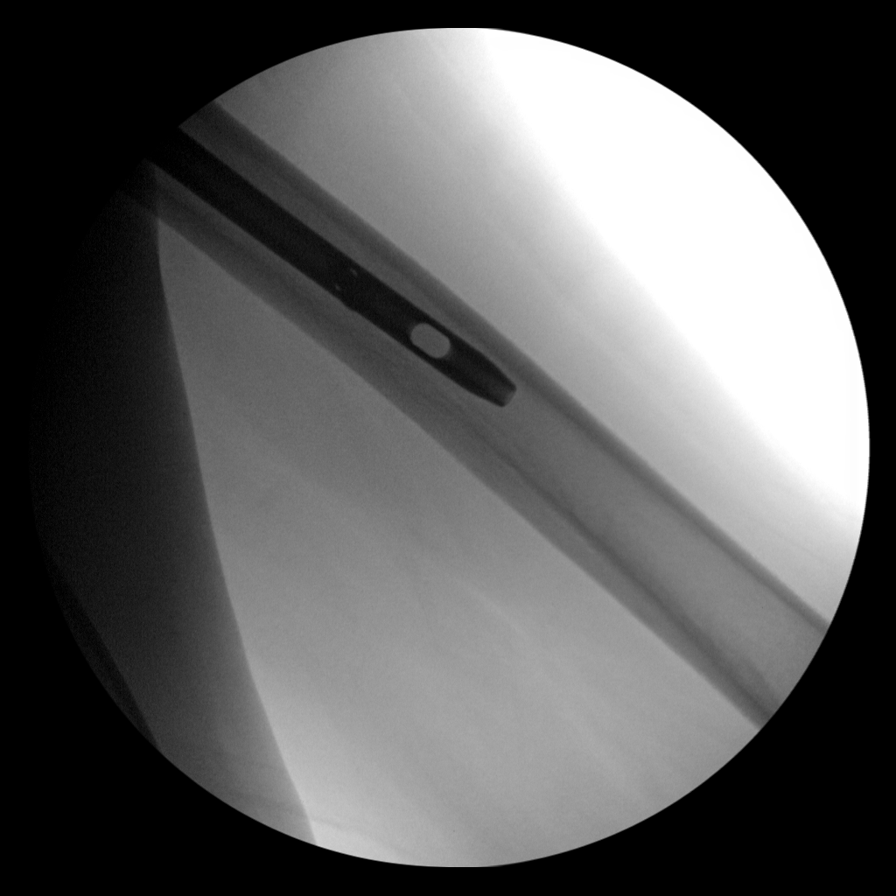

[6 of 6 positions shown; findings below may reference images not displayed]

FINDINGS: Sequential portable images show placement of a short intramedullary
rod supporting a compression screw, reducing the intertrochanteric
fracture into near anatomic alignment. The orthopedic hardware
appears well seated.
IMPRESSION: Operative imaging provided for left intertrochanteric fracture ORIF.
Fracture is well aligned. Orthopedic hardware
well-seated/positioned.

## 2023-12-16 ENCOUNTER — Other Ambulatory Visit (HOSPITAL_COMMUNITY): Payer: Self-pay

## 2024-01-13 ENCOUNTER — Other Ambulatory Visit (HOSPITAL_COMMUNITY): Payer: Self-pay

## 2024-01-13 ENCOUNTER — Encounter: Payer: Self-pay | Admitting: Family Medicine

## 2024-01-13 ENCOUNTER — Encounter: Payer: Commercial Managed Care - PPO | Admitting: Family Medicine

## 2024-01-13 ENCOUNTER — Ambulatory Visit: Payer: Commercial Managed Care - PPO | Admitting: Family Medicine

## 2024-01-13 VITALS — BP 143/89 | HR 66 | Temp 97.8°F | Ht 65.0 in | Wt 159.0 lb

## 2024-01-13 DIAGNOSIS — I10 Essential (primary) hypertension: Secondary | ICD-10-CM | POA: Diagnosis not present

## 2024-01-13 DIAGNOSIS — F4321 Adjustment disorder with depressed mood: Secondary | ICD-10-CM | POA: Diagnosis not present

## 2024-01-13 DIAGNOSIS — E042 Nontoxic multinodular goiter: Secondary | ICD-10-CM

## 2024-01-13 DIAGNOSIS — F900 Attention-deficit hyperactivity disorder, predominantly inattentive type: Secondary | ICD-10-CM | POA: Diagnosis not present

## 2024-01-13 DIAGNOSIS — M47816 Spondylosis without myelopathy or radiculopathy, lumbar region: Secondary | ICD-10-CM

## 2024-01-13 DIAGNOSIS — K2900 Acute gastritis without bleeding: Secondary | ICD-10-CM | POA: Diagnosis not present

## 2024-01-13 DIAGNOSIS — Z23 Encounter for immunization: Secondary | ICD-10-CM | POA: Diagnosis not present

## 2024-01-13 DIAGNOSIS — E063 Autoimmune thyroiditis: Secondary | ICD-10-CM

## 2024-01-13 DIAGNOSIS — Z0001 Encounter for general adult medical examination with abnormal findings: Secondary | ICD-10-CM

## 2024-01-13 LAB — COMPREHENSIVE METABOLIC PANEL
ALT: 31 U/L (ref 0–35)
AST: 38 U/L — ABNORMAL HIGH (ref 0–37)
Albumin: 4.7 g/dL (ref 3.5–5.2)
Alkaline Phosphatase: 90 U/L (ref 39–117)
BUN: 11 mg/dL (ref 6–23)
CO2: 28 meq/L (ref 19–32)
Calcium: 9.7 mg/dL (ref 8.4–10.5)
Chloride: 101 meq/L (ref 96–112)
Creatinine, Ser: 0.81 mg/dL (ref 0.40–1.20)
GFR: 82.22 mL/min (ref >60.00)
Glucose, Bld: 104 mg/dL — ABNORMAL HIGH (ref 70–99)
Potassium: 3.9 meq/L (ref 3.5–5.1)
Sodium: 138 meq/L (ref 135–145)
Total Bilirubin: 1.1 mg/dL (ref 0.2–1.2)
Total Protein: 7.5 g/dL (ref 6.0–8.3)

## 2024-01-13 LAB — CBC WITH DIFFERENTIAL/PLATELET
Basophils Absolute: 0.1 10*3/uL (ref 0.0–0.1)
Basophils Relative: 1.1 % (ref 0.0–3.0)
Eosinophils Absolute: 0.1 10*3/uL (ref 0.0–0.7)
Eosinophils Relative: 2.6 % (ref 0.0–5.0)
HCT: 44.5 % (ref 36.0–46.0)
Hemoglobin: 15.2 g/dL — ABNORMAL HIGH (ref 12.0–15.0)
Lymphocytes Relative: 31.7 % (ref 12.0–46.0)
Lymphs Abs: 1.5 10*3/uL (ref 0.7–4.0)
MCHC: 34.1 g/dL (ref 30.0–36.0)
MCV: 103.2 fL — ABNORMAL HIGH (ref 78.0–100.0)
Monocytes Absolute: 0.3 10*3/uL (ref 0.1–1.0)
Monocytes Relative: 6.3 % (ref 3.0–12.0)
Neutro Abs: 2.8 10*3/uL (ref 1.4–7.7)
Neutrophils Relative %: 58.3 % (ref 43.0–77.0)
Platelets: 250 10*3/uL (ref 150.0–400.0)
RBC: 4.32 Mil/uL (ref 3.87–5.11)
RDW: 12.8 % (ref 11.5–15.5)
WBC: 4.7 10*3/uL (ref 4.0–10.5)

## 2024-01-13 LAB — LIPID PANEL
Cholesterol: 258 mg/dL — ABNORMAL HIGH (ref 0–200)
HDL: 70.9 mg/dL (ref >39.00)
LDL Cholesterol: 151 mg/dL — ABNORMAL HIGH (ref 0–99)
NonHDL: 186.61
Total CHOL/HDL Ratio: 4
Triglycerides: 177 mg/dL — ABNORMAL HIGH (ref 0.0–149.0)
VLDL: 35.4 mg/dL (ref 0.0–40.0)

## 2024-01-13 MED ORDER — AMPHETAMINE-DEXTROAMPHET ER 20 MG PO CP24
20.0000 mg | ORAL_CAPSULE | Freq: Every day | ORAL | 0 refills | Status: DC
  Filled 2024-05-25: qty 30, 30d supply, fill #0

## 2024-01-13 MED ORDER — AMPHETAMINE-DEXTROAMPHET ER 20 MG PO CP24
20.0000 mg | ORAL_CAPSULE | Freq: Every day | ORAL | 0 refills | Status: DC
  Filled 2024-03-23: qty 30, 30d supply, fill #0

## 2024-01-13 MED ORDER — AMPHETAMINE-DEXTROAMPHET ER 20 MG PO CP24
20.0000 mg | ORAL_CAPSULE | Freq: Every day | ORAL | 0 refills | Status: DC
  Filled 2024-02-17: qty 30, 30d supply, fill #0

## 2024-01-13 MED ORDER — AMPHETAMINE-DEXTROAMPHET ER 20 MG PO CP24: 20.0000 mg | ORAL_CAPSULE | Freq: Every day | ORAL | 0 refills | Status: DC

## 2024-01-13 MED ORDER — AMPHETAMINE-DEXTROAMPHET ER 20 MG PO CP24
20.0000 mg | ORAL_CAPSULE | Freq: Every day | ORAL | 0 refills | Status: DC
  Filled 2024-01-13 (×2): qty 11, 11d supply, fill #0
  Filled 2024-01-13: qty 19, 19d supply, fill #0

## 2024-01-13 MED ORDER — AMPHETAMINE-DEXTROAMPHET ER 20 MG PO CP24
20.0000 mg | ORAL_CAPSULE | Freq: Every day | ORAL | 0 refills | Status: DC
  Filled 2024-04-27: qty 30, 30d supply, fill #0

## 2024-01-13 NOTE — Progress Notes (Signed)
Subjective  Chief Complaint  Patient presents with   Annual Exam    Pt here for Annual Exam and is not currently fasting. Pt will schedule pap/mammo. Goes to [facility]   Hypertension    HPI: Alison [name] is a 54 y.o. female who presents to [facility] [place] today for a Female Wellness Visit. She also has the concerns and/or needs as listed above in the chief complaint. These will be addressed in addition to the Health Maintenance Visit.   Wellness Visit: annual visit with health maintenance review and exam  HM: screens are all current. Mammo due in March. Neg cologuard last year. Avg risk pt. Pap up to date. Sees gyn. IUD is out and pt is amenorrheic. Eligible for shingrix.  Chronic disease f/u and/or acute problem visit: (deemed necessary to be done in addition to the wellness visit): Discussed the use of AI scribe software for clinical note transcription with the patient, who gave verbal consent to proceed.  History of Present Illness   The patient, a 53 year old with a history of Hashimoto's thyroiditis and degenerative back disease, presented with concerns about gastrointestinal symptoms and cardiovascular health. The patient has been under significant stress due to the recent death of her uncle, for whom she was the healthcare power of attorney.  The patient reported a longstanding history of a hemorrhoid, which has been present since her teenage years and worsened after childbirth. Recently, she has been experiencing increased bowel movements, occasional diarrhea, and episodes of sudden-onset nausea that resolve quickly. The patient has a history of irritable bowel syndrome (IBS) and noted that certain foods, particularly spicy ones, can exacerbate her symptoms. She has been managing these symptoms with dietary modifications and over-the-counter omeprazole.  In addition to gastrointestinal concerns, the patient expressed worry about her cardiovascular  health, given a family history of early cardiac events and stroke. She reported occasional episodes of a pinching sensation in the chest, which she had previously investigated with a stress echo about ten years ago. The patient noted that these episodes do not coincide with shortness of breath or exertion, and they resolve spontaneously.  The patient also mentioned experiencing episodes of sudden heat and sweating, particularly during physical exertion. She has a history of menopause and has had an intrauterine device (IUD) removed within the last year. The patient has not been monitoring her blood pressure at home but was informed during the visit that it was elevated.  ADD remains well controlled on meds.   I reviewed notes and ultrasound and labs from endocrinology. MNG is stable and + hashimotos.  ROS neg for cp, palpitations, doe, fatigue, change in sleep, edema  Assessment  1. Encounter for well adult exam with abnormal findings   2. Attention deficit hyperactivity disorder (ADHD), predominantly inattentive type   3. Essential hypertension   4. Multinodular goiter   5. Need for shingles vaccine   6. Hashimoto's thyroiditis   7. Spondylosis of lumbar region without myelopathy or radiculopathy   8. Other acute gastritis without hemorrhage   9. Grief reaction      Plan  Female Wellness Visit: Age appropriate Health Maintenance and Prevention measures were discussed with patient. Included topics are cancer screening recommendations, ways to keep healthy (see AVS) including dietary and exercise recommendations, regular eye and dental care, use of seat belts, and avoidance of moderate alcohol use and tobacco use. Screens are current BMI: discussed patient's BMI and encouraged positive lifestyle modifications to help  get to or maintain a target BMI. HM needs and immunizations were addressed and ordered. See below for orders. See HM and immunization section for updates. Shingrix 1st given  today Routine labs and screening tests ordered including cmp, cbc and lipids where appropriate. Discussed recommendations regarding Vit D and calcium supplementation (see AVS)  Chronic disease management visit and/or acute problem visit: Assessment and Plan    Gastritis, stress induced.  Intermittent nausea and occasional diarrhea, likely stress-induced. No red flag symptoms for colon cancer. Negative Cologuard in July. Omeprazole has been effective in the past. Colonoscopy not indicated for nausea. Recommended omeprazole for 6-12 weeks to calm inflammation and reassess symptoms. - Prescribe omeprazole for 6-12 weeks - Reassess symptoms in 6-12 weeks - reassured.   Hypertension Elevated blood pressure noted today. Family history of cardiovascular issues including stroke and heart disease. Patient has not been monitoring blood pressure at home. Discussed importance of home monitoring to determine if elevation is stress-related or requires medication adjustment. - Monitor blood pressure at home - Follow up in 6-8 weeks with home blood pressure log - continue metoprolol xl 50 daily  Hashimoto's Thyroiditis Well-managed autoimmune thyroiditis. No current need for thyroid medication. Discussed potential future need for thyroid hormone replacement. - Monitor thyroid function  ADD: refilled meds x 6 months. Good control.   DJD lumbar is stable  Grief reaction: counseling done.   General Health Maintenance Due for shingles vaccination. - Administer Shingrix vaccine today - Schedule follow-up for second dose in 2-6 months  Follow-up - Schedule follow-up appointment in 8 weeks to recheck bp and sxs.   Orders Placed This Encounter  Procedures   Zoster Recombinant (Shingrix )   Lipid panel   Comprehensive metabolic panel   CBC with Differential/Platelet   Meds ordered this encounter  Medications   amphetamine-dextroamphetamine (ADDERALL XR) 20 MG 24 hr capsule    Sig: Take 1  capsule (20 mg total) by mouth daily.    Dispense:  30 capsule    Refill:  0   amphetamine-dextroamphetamine (ADDERALL XR) 20 MG 24 hr capsule    Sig: Take 1 capsule (20 mg total) by mouth daily.    Dispense:  30 capsule    Refill:  0   amphetamine-dextroamphetamine (ADDERALL XR) 20 MG 24 hr capsule    Sig: Take 1 capsule (20 mg total) by mouth daily.    Dispense:  30 capsule    Refill:  0   amphetamine-dextroamphetamine (ADDERALL XR) 20 MG 24 hr capsule    Sig: Take 1 capsule (20 mg total) by mouth daily.    Dispense:  30 capsule    Refill:  0   amphetamine-dextroamphetamine (ADDERALL XR) 20 MG 24 hr capsule    Sig: Take 1 capsule (20 mg total) by mouth daily.    Dispense:  30 capsule    Refill:  0   amphetamine-dextroamphetamine (ADDERALL XR) 20 MG 24 hr capsule    Sig: Take 1 capsule (20 mg total) by mouth daily.    Dispense:  30 capsule    Refill:  0      Body mass index is 26.46 kg/m. Wt Readings from Last 3 Encounters:  01/13/24 159 lb (72.1 kg)  11/05/23 160 lb (72.6 kg)  07/13/23 162 lb 6.4 oz (73.7 kg)     Patient Active Problem List   Diagnosis Date Noted Date Diagnosed   Depression, major, single episode, mild (HCC) 05/14/2020     Priority: High    Treated with lexapro  successfully 04/2020, resolved    Attention deficit hyperactivity disorder 10/02/2019     Priority: High   Essential hypertension 05/09/2018     Priority: High    Toprol-XL 50 mg daily started in 2022    Hashimoto's thyroiditis 01/13/2024     Priority: Medium     + TPO antibodies 10/2023. Variable Tsh, monitoring, Dr. Lonzo Cloud. Thyroid ultrasound shows stable MNG.    Multinodular goiter 11/05/2023     Priority: Medium     Pt has been noted with local neck enlargement  - S/P FNA of the left 3 cm nodule in 2022 with follicular lesion of unknown significance ( Bethesda Category III) , Afirma benign  F/u ultrasound 10/2023 shows stable MNG. + hashimotos' antibody. Monitor. Dr. Lonzo Cloud  now following.    Left thyroid nodule 06/03/2021     Priority: Medium     Large, noted on ultrasound and exam 05/2021; referred to endocrinology for FNA - indeterminant and referred to endocrinology; Biopsy and Affirma genome sequencing 05/2021 benign.  Referred again for f/u 2024.    Osteopenia 10/02/2019     Priority: Medium    DJD (degenerative joint disease), lumbar 05/09/2018     Priority: Medium    Urinary, incontinence, stress female 05/14/2021     Priority: Low   Seasonal allergic rhinitis due to pollen 05/14/2021     Priority: Low   Genital warts 10/02/2019     Priority: Low   Goiter 06/20/2019     Priority: Low   Health Maintenance  Topic Date Due   COVID-19 Vaccine (3 - 2024-25 season) 01/29/2024 (Originally 08/29/2023)   Zoster Vaccines- Shingrix (2 of 2) 04/12/2024 (Originally 03/09/2024)   MAMMOGRAM  03/20/2024   Fecal DNA (Cologuard)  02/09/2026   DTaP/Tdap/Td (2 - Td or Tdap) 01/19/2027   Cervical Cancer Screening (HPV/Pap Cotest)  03/20/2028   INFLUENZA VACCINE  Completed   Hepatitis C Screening  Completed   HIV Screening  Completed   HPV VACCINES  Aged Out   Immunization History  Administered Date(s) Administered   Influenza,inj,Quad PF,6+ Mos 10/12/2023   Influenza-Unspecified 09/27/2016, 09/27/2018, 10/22/2020, 10/11/2022   PFIZER(Purple Top)SARS-COV-2 Vaccination 01/11/2020, 02/01/2020   Tdap 01/19/2017   Zoster Recombinant(Shingrix) 01/13/2024   We updated and reviewed the patient's past history in detail and it is documented below. Allergies: Patient has no known allergies. Past Medical History Patient  has a past medical history of Adult ADHD, Closed left hip fracture (HCC) (03/21/2022), Degeneration of intervertebral disc of lumbar region, and Hypertension. Past Surgical History Patient  has a past surgical history that includes Tonsillectomy; Lumbar disc surgery (2010); and Intramedullary (im) nail intertrochanteric (Left, 03/22/2022). Family  History: Patient family history includes Anxiety disorder in her sister; Atrial fibrillation in her father; COPD in her father; Fibroids in her sister; Goiter in her mother; Healthy in her daughter; Heart disease in her father, maternal uncle, and mother; Hypertension in her father, mother, and sister; Lymphoma in her father; Stroke in her mother; Sudden Cardiac Death in her mother. Social History:  Patient  reports that she has quit smoking. Her smoking use included cigarettes. She has never used smokeless tobacco. She reports that she does not drink alcohol and does not use drugs.  Review of Systems: Constitutional: negative for fever or malaise Ophthalmic: negative for photophobia, double vision or loss of vision Cardiovascular: negative for chest pain, dyspnea on exertion, or new LE swelling Respiratory: negative for SOB or persistent cough Gastrointestinal: negative for abdominal pain, change in bowel  habits or melena Genitourinary: negative for dysuria or gross hematuria, no abnormal uterine bleeding or disharge Musculoskeletal: negative for new gait disturbance or muscular weakness Integumentary: negative for new or persistent rashes, no breast lumps Neurological: negative for TIA or stroke symptoms Psychiatric: negative for SI or delusions Allergic/Immunologic: negative for hives  Patient Care Team    Relationship Specialty Notifications Start End  Willow Ora, MD PCP - General Family Medicine  05/09/18   Richarda Overlie, MD Consulting Physician Obstetrics and Gynecology  05/09/18   Shirlean Kelly, MD Consulting Physician Neurosurgery  05/09/18     Objective  Vitals: BP (!) 143/89 Comment: machine  Pulse 66   Temp 97.8 F (36.6 C)   Ht 5\' 5"  (1.651 m)   Wt 159 lb (72.1 kg)   SpO2 97%   BMI 26.46 kg/m  General:  Well developed, well nourished, no acute distress  Psych:  Alert and orientedx3,normal mood and affect but tearful at times and mildly anxious HEENT:   Normocephalic, atraumatic, non-icteric sclera,  supple neck without adenopathy, mass or thyromegaly Cardiovascular:  Normal S1, S2, RRR without gallop, rub or murmur Respiratory:  Good breath sounds bilaterally, CTAB with normal respiratory effort Gastrointestinal: normal bowel sounds, soft, non-tender, no noted masses. No HSM MSK: extremities without edema, joints without erythema or swelling Neurologic:    Mental status is normal.  Gross motor and sensory exams are normal.  No tremor Lab Results  Component Value Date   TSH 2.96 11/05/2023    Commons side effects, risks, benefits, and alternatives for medications and treatment plan prescribed today were discussed, and the patient expressed understanding of the given instructions. Patient is instructed to call or message via MyChart if he/she has any questions or concerns regarding our treatment plan. No barriers to understanding were identified. We discussed Red Flag symptoms and signs in detail. Patient expressed understanding regarding what to do in case of urgent or emergency type symptoms.  Medication list was reconciled, printed and provided to the patient in AVS. Patient instructions and summary information was reviewed with the patient as documented in the AVS. This note was prepared with assistance of Dragon voice recognition software. Occasional wrong-word or sound-a-like substitutions may have occurred due to the inherent limitations of voice recognition software

## 2024-01-13 NOTE — Patient Instructions (Signed)
Please return in 8 weeks for recheck.  Please bring in your blood pressure readings from home.   If you have any questions or concerns, please don't hesitate to send me a message via MyChart or call the office at [phone]. Thank you for visiting with [place] today! It's our pleasure caring for you.   VISIT SUMMARY:  During today's visit, we discussed your gastrointestinal symptoms, cardiovascular health, and general well-being. We addressed your concerns about increased bowel movements, occasional diarrhea, and episodes of sudden-onset nausea. We also reviewed your cardiovascular health, including your elevated blood pressure and family history of heart disease. Additionally, we talked about your well-managed Hashimoto's thyroiditis and the need for general health maintenance, including vaccinations.  YOUR PLAN:  -GASTRITIS: Gastritis is inflammation of the stomach lining, which can cause nausea and diarrhea. We believe your symptoms may be stress-induced. We recommend continuing omeprazole for 6-12 weeks to reduce inflammation and will reassess your symptoms after this period.  -HYPERTENSION: Hypertension, or high blood pressure, can increase the risk of heart disease and stroke. Your blood pressure was elevated today, and we recommend monitoring it at home to determine if it is stress-related or if medication adjustments are needed. Please keep a log of your blood pressure readings and follow up in 8 weeks.  -HASHIMOTO'S THYROIDITIS: Hashimoto's thyroiditis is an autoimmune condition that affects the thyroid gland. Your condition is currently well-managed, and there is no need for thyroid medication at this time. We will continue to monitor your thyroid function.  I have refilled your ADD medications for 6 months.   -GENERAL HEALTH MAINTENANCE: You are due for the shingles vaccination. We administered the first dose of the Shingrix vaccine today and will schedule the second dose in 2-6 months.  Keeping up with vaccinations is important for your overall health.  INSTRUCTIONS:  Please schedule a follow-up appointment in 6-8 weeks. During this visit, we will review your blood pressure log and reassess your gastrointestinal symptoms. Additionally, we will monitor your thyroid function and ensure you receive the second dose of the Shingrix vaccine.

## 2024-01-14 ENCOUNTER — Other Ambulatory Visit (HOSPITAL_COMMUNITY): Payer: Self-pay

## 2024-01-19 ENCOUNTER — Encounter: Payer: Self-pay | Admitting: Family Medicine

## 2024-01-19 DIAGNOSIS — E538 Deficiency of other specified B group vitamins: Secondary | ICD-10-CM | POA: Insufficient documentation

## 2024-01-19 NOTE — Progress Notes (Signed)
Labs reviewed. nonfasting The 10-year ASCVD risk score (Arnett DK, et al., 2019) is: 3.1%   Values used to calculate the score:     Age: 53 years     Sex: Female     Is Non-Hispanic African American: No     Diabetic: No     Tobacco smoker: No     Systolic Blood Pressure: 143 mmHg     Is BP treated: Yes     HDL Cholesterol: 70.9 mg/dL     Total Cholesterol: 258 mg/dL  Dear Alison [name], Thank you for allowing me to care for you at your recent office visit.  I wanted to let you know that I have reviewed your lab test results and am happy to report that they are stable.  Your cholesterol is high again but not high enough to warrant medication. Please eat a low fat diet to improve your numbers.   Your blood tests show findings that could point to a low vit B12 level. I did not recheck this level but if you wanted, you could return or I could check it at your follow up visit. Please start otc vit B12 daily to help correct this.  I will see you again soon.  Sincerely, Dr. Mardelle Matte

## 2024-01-25 ENCOUNTER — Other Ambulatory Visit (HOSPITAL_COMMUNITY): Payer: Self-pay

## 2024-02-17 ENCOUNTER — Other Ambulatory Visit (HOSPITAL_COMMUNITY): Payer: Self-pay

## 2024-03-14 ENCOUNTER — Ambulatory Visit: Payer: Commercial Managed Care - PPO | Admitting: Family Medicine

## 2024-03-14 ENCOUNTER — Encounter: Payer: Self-pay | Admitting: Family Medicine

## 2024-03-14 ENCOUNTER — Other Ambulatory Visit (HOSPITAL_COMMUNITY): Payer: Self-pay

## 2024-03-14 VITALS — BP 145/78 | HR 67 | Temp 97.7°F | Ht 65.0 in | Wt 156.2 lb

## 2024-03-14 DIAGNOSIS — Z136 Encounter for screening for cardiovascular disorders: Secondary | ICD-10-CM

## 2024-03-14 DIAGNOSIS — D7589 Other specified diseases of blood and blood-forming organs: Secondary | ICD-10-CM | POA: Diagnosis not present

## 2024-03-14 DIAGNOSIS — K219 Gastro-esophageal reflux disease without esophagitis: Secondary | ICD-10-CM | POA: Diagnosis not present

## 2024-03-14 DIAGNOSIS — F4321 Adjustment disorder with depressed mood: Secondary | ICD-10-CM | POA: Diagnosis not present

## 2024-03-14 DIAGNOSIS — Z23 Encounter for immunization: Secondary | ICD-10-CM

## 2024-03-14 DIAGNOSIS — E538 Deficiency of other specified B group vitamins: Secondary | ICD-10-CM

## 2024-03-14 DIAGNOSIS — I10 Essential (primary) hypertension: Secondary | ICD-10-CM | POA: Diagnosis not present

## 2024-03-14 DIAGNOSIS — F4320 Adjustment disorder, unspecified: Secondary | ICD-10-CM

## 2024-03-14 LAB — CBC WITH DIFFERENTIAL/PLATELET
Basophils Absolute: 0 10*3/uL (ref 0.0–0.1)
Basophils Relative: 0.9 % (ref 0.0–3.0)
Eosinophils Absolute: 0.2 10*3/uL (ref 0.0–0.7)
Eosinophils Relative: 3.9 % (ref 0.0–5.0)
HCT: 45 % (ref 36.0–46.0)
Hemoglobin: 15.4 g/dL — ABNORMAL HIGH (ref 12.0–15.0)
Lymphocytes Relative: 31.1 % (ref 12.0–46.0)
Lymphs Abs: 1.6 10*3/uL (ref 0.7–4.0)
MCHC: 34.2 g/dL (ref 30.0–36.0)
MCV: 103.5 fl — ABNORMAL HIGH (ref 78.0–100.0)
Monocytes Absolute: 0.3 10*3/uL (ref 0.1–1.0)
Monocytes Relative: 5.7 % (ref 3.0–12.0)
Neutro Abs: 3.1 10*3/uL (ref 1.4–7.7)
Neutrophils Relative %: 58.4 % (ref 43.0–77.0)
Platelets: 267 10*3/uL (ref 150.0–400.0)
RBC: 4.35 Mil/uL (ref 3.87–5.11)
RDW: 12.8 % (ref 11.5–15.5)
WBC: 5.2 10*3/uL (ref 4.0–10.5)

## 2024-03-14 LAB — B12 AND FOLATE PANEL
Folate: 4.8 ng/mL — ABNORMAL LOW (ref >5.9)
Vitamin B-12: 133 pg/mL — ABNORMAL LOW (ref 211–911)

## 2024-03-14 MED ORDER — METOPROLOL SUCCINATE ER 100 MG PO TB24
100.0000 mg | ORAL_TABLET | Freq: Every day | ORAL | 3 refills | Status: DC
Start: 2024-03-14 — End: 2024-07-10
  Filled 2024-03-14: qty 90, 90d supply, fill #0

## 2024-03-14 NOTE — Patient Instructions (Signed)
 Please return in 3 months for ADD and blood pressure recheck.   I will release your lab results to you on your MyChart account with further instructions. You may see the results before I do, but when I review them I will send you a message with my report or have my assistant call you if things need to be discussed. Please reply to my message with any questions. Thank you!   If you have any questions or concerns, please don't hesitate to send me a message via MyChart or call the office at [phone]. Thank you for visiting with [place] today! It's our pleasure caring for you.   VISIT SUMMARY:  Today, we discussed your blood pressure, stomach issues, B12 levels, and general health maintenance. We made some adjustments to your medication and planned for further tests and follow-ups to ensure your health remains on track.  YOUR PLAN:  -HYPERTENSION: Hypertension means high blood pressure. We will increase your metoprolol dose to 100 mg daily to help control your blood pressure during activities. Please monitor your blood pressure at home, especially when you are active. Please monitor your heart rate as this medication can make it go low.   -GASTROESOPHAGEAL REFLUX DISEASE (GERD): GERD is a condition where stomach acid frequently flows back into the tube connecting your mouth and stomach, causing heartburn. Continue taking omeprazole as it is effectively managing your symptoms.  -VITAMIN B12 DEFICIENCY: Vitamin B12 deficiency occurs when your body doesn't have enough of this vitamin, which is essential for nerve function and the production of red blood cells. We will check your B12 levels to determine if you need to start supplementation.  -CARDIOVASCULAR RISK ASSESSMENT: This assessment helps determine your risk of heart disease. We will order a cardiac CT scan to evaluate your cardiovascular health. Please schedule this test and follow up with [place] for the results.  -GENERAL HEALTH MAINTENANCE: You are  due for a shingles booster vaccine. Although you experienced mild symptoms after the first dose, the benefits of the booster outweigh the side effects. We will administer the shingles booster vaccine today.  INSTRUCTIONS:  Please schedule a follow-up appointment in 3 to 6 months to evaluate your blood pressure and how you are tolerating the increased metoprolol dose. Additionally, we will coordinate a follow-up with your endocrinologist. Make sure to monitor your blood pressure at home, especially during activities, and schedule your cardiac CT scan as discussed.

## 2024-03-14 NOTE — Progress Notes (Signed)
 Subjective  CC:  Chief Complaint  Patient presents with   Hypertension   Depression    HPI: Alison [name] is a 53 y.o. female who presents to the office today to address the problems listed above in the chief complaint. Discussed the use of AI scribe software for clinical note transcription with the patient, who gave verbal consent to proceed.  History of Present Illness   Alison [name] is a 53 year old female with hypertension who presents for follow-up on blood pressure, mood, energy levels, stomach issues, and B12 level. See last note.  HTN f/u: Her blood pressure is elevated during office visits but can be as low as 117 mmHg systolic at home when resting. However, it rises to the 140s/80s when she is active. She takes metoprolol 50 mg at night. Feeling well. Taking medications w/o adverse effects. No symptoms of CHF, angina; no palpitations, sob, cp or lower extremity edema. Compliant with meds.  She has concerns regarding her cardiovascular risk.  She does not family history.  Lipid panel shows borderline readings with low to intermediate risk.  She is requesting cardiac CT screening  GERD: much improved on daily omeprazole.   Vitamin B12 deficiency: She has not started a B12 supplement and is awaiting her B12 level results to determine if supplementation is necessary.  CBC showed macrocytosis without anemia at last visit.  Grief reaction: She is improving.  Energy levels are better.  Eligible for second Shingrix vaccination today.  Had some mild flulike symptoms after her first.     Assessment  1. Essential hypertension   2. Need for shingles vaccine   3. Macrocytosis   4. Grief reaction   5. Vitamin B12 deficiency   6. Gastroesophageal reflux disease, unspecified whether esophagitis present   7. Ischemic heart disease screen      Plan   Assessment and Plan     Hypertension Blood pressure elevated with variability in home readings.  - Increase metoprolol to  100 mg daily.  Monitor for bradycardia - Monitor blood pressure at home, especially during activities.  Gastroesophageal Reflux Disease (GERD) Heartburn managed with omeprazole, indicating ongoing GERD. - Continue omeprazole.  Improved  Vitamin B12 Deficiency B12 supplementation not started. B12 level will be checked to determine necessity. - Order B12 level to assess status and need for supplementation.  Cardiovascular Risk Assessment Interested in cardiac CT for cancer screening, but appropriate for cardiovascular risk stratification. Asymptomatic with borderline cholesterol. - Order cardiac CT scan for cardiovascular risk assessment. Schedule test and follow up with results.  General Health Maintenance Due for shingles booster. Previous dose caused mild symptoms, but benefits outweigh side effects. - Administer shingles booster vaccine.  Follow-up Follow-up needed for blood pressure control and medication response. Coordination with endocrinologist required. - Schedule follow-up in 3 to 6 months for blood pressure evaluation and medication tolerance.  And ADD follow-up - Coordinate follow-up with endocrinologist.        Education regarding management of these chronic disease states was given. Management strategies discussed on successive visits include dietary and exercise recommendations, goals of achieving and maintaining IBW, and lifestyle modifications aiming for adequate sleep and minimizing stressors.     Orders Placed This Encounter  Procedures   CT CORONARY MORPH W/CTA COR W/SCORE W/CA W/CM &/OR WO/CM   Zoster Recombinant (Shingrix )   B12 and Folate Panel   CBC with Differential/Platelet   Meds ordered this encounter  Medications   metoprolol succinate (TOPROL-XL) 100 MG  24 hr tablet    Sig: Take 1 tablet (100 mg total) by mouth daily.    Dispense:  90 tablet    Refill:  3      BP Readings from Last 3 Encounters:  03/14/24 (!) 145/78  01/13/24 (!) 143/89   11/05/23 120/80   Wt Readings from Last 3 Encounters:  03/14/24 156 lb 3.2 oz (70.9 kg)  01/13/24 159 lb (72.1 kg)  11/05/23 160 lb (72.6 kg)    Lab Results  Component Value Date   CHOL 258 (H) 01/13/2024   CHOL 211 (H) 01/29/2023   CHOL 223 (H) 05/14/2021   Lab Results  Component Value Date   HDL 70.90 01/13/2024   HDL 60.10 01/29/2023   HDL 63.20 05/14/2021   Lab Results  Component Value Date   LDLCALC 151 (H) 01/13/2024   LDLCALC 129 (H) 01/29/2023   LDLCALC 137 (H) 05/14/2021   Lab Results  Component Value Date   TRIG 177.0 (H) 01/13/2024   TRIG 113.0 01/29/2023   TRIG 115.0 05/14/2021   Lab Results  Component Value Date   CHOLHDL 4 01/13/2024   CHOLHDL 4 01/29/2023   CHOLHDL 4 05/14/2021   No results found for: "LDLDIRECT" Lab Results  Component Value Date   CREATININE 0.81 01/13/2024   BUN 11 01/13/2024   NA 138 01/13/2024   K 3.9 01/13/2024   CL 101 01/13/2024   CO2 28 01/13/2024    The 10-year ASCVD risk score (Arnett DK, et al., 2019) is: 3.2%   Values used to calculate the score:     Age: 19 years     Sex: Female     Is Non-Hispanic African American: No     Diabetic: No     Tobacco smoker: No     Systolic Blood Pressure: 145 mmHg     Is BP treated: Yes     HDL Cholesterol: 70.9 mg/dL     Total Cholesterol: 258 mg/dL  I reviewed the patients updated PMH, FH, and SocHx.    Patient Active Problem List   Diagnosis Date Noted   Depression, major, single episode, mild (HCC) 05/14/2020    Priority: High   Attention deficit hyperactivity disorder 10/02/2019    Priority: High   Essential hypertension 05/09/2018    Priority: High   Hashimoto's thyroiditis 01/13/2024    Priority: Medium    Multinodular goiter 11/05/2023    Priority: Medium    Left thyroid nodule 06/03/2021    Priority: Medium    Osteopenia 10/02/2019    Priority: Medium    DJD (degenerative joint disease), lumbar 05/09/2018    Priority: Medium    Vitamin B12 deficiency  01/19/2024    Priority: Low   Urinary, incontinence, stress female 05/14/2021    Priority: Low   Seasonal allergic rhinitis due to pollen 05/14/2021    Priority: Low   Genital warts 10/02/2019    Priority: Low   Goiter 06/20/2019    Priority: Low    Allergies: Patient has no known allergies.  Social History: Patient  reports that she has quit smoking. Her smoking use included cigarettes. She has never used smokeless tobacco. She reports that she does not drink alcohol and does not use drugs.  Current Meds  Medication Sig   amphetamine-dextroamphetamine (ADDERALL XR) 20 MG 24 hr capsule Take 1 capsule (20 mg total) by mouth daily.   amphetamine-dextroamphetamine (ADDERALL XR) 20 MG 24 hr capsule Take 1 capsule (20 mg total) by mouth daily.  amphetamine-dextroamphetamine (ADDERALL XR) 20 MG 24 hr capsule Take 1 capsule (20 mg total) by mouth daily.   [START ON 04/12/2024] amphetamine-dextroamphetamine (ADDERALL XR) 20 MG 24 hr capsule Take 1 capsule (20 mg total) by mouth daily.   [START ON 05/12/2024] amphetamine-dextroamphetamine (ADDERALL XR) 20 MG 24 hr capsule Take 1 capsule (20 mg total) by mouth daily.   [START ON 06/11/2024] amphetamine-dextroamphetamine (ADDERALL XR) 20 MG 24 hr capsule Take 1 capsule (20 mg total) by mouth daily.   [DISCONTINUED] metoprolol succinate (TOPROL-XL) 50 MG 24 hr tablet Take 1 tablet (50 mg total) by mouth daily.    Review of Systems: Cardiovascular: negative for chest pain, palpitations, leg swelling, orthopnea Respiratory: negative for SOB, wheezing or persistent cough Gastrointestinal: negative for abdominal pain Genitourinary: negative for dysuria or gross hematuria  Objective  Vitals: BP (!) 145/78   Pulse 67   Temp 97.7 F (36.5 C)   Ht 5\' 5"  (1.651 m)   Wt 156 lb 3.2 oz (70.9 kg)   SpO2 100%   BMI 25.99 kg/m  General: no acute distress  Psych:  Alert and oriented, normal mood and affect HEENT:  Normocephalic, atraumatic, supple  neck  Cardiovascular:  RRR without murmur. no edema Respiratory:  Good breath sounds bilaterally, CTAB with normal respiratory effort Abdomen without midepigastric tenderness, soft no masses Skin:  Warm, no rashes Neurologic:   Mental status is normal Commons side effects, risks, benefits, and alternatives for medications and treatment plan prescribed today were discussed, and the patient expressed understanding of the given instructions. Patient is instructed to call or message via MyChart if he/she has any questions or concerns regarding our treatment plan. No barriers to understanding were identified. We discussed Red Flag symptoms and signs in detail. Patient expressed understanding regarding what to do in case of urgent or emergency type symptoms.  Medication list was reconciled, printed and provided to the patient in AVS. Patient instructions and summary information was reviewed with the patient as documented in the AVS. This note was prepared with assistance of Dragon voice recognition software. Occasional wrong-word or sound-a-like substitutions may have occurred due to the inherent limitation

## 2024-03-22 ENCOUNTER — Encounter: Payer: Self-pay | Admitting: Family Medicine

## 2024-03-22 NOTE — Progress Notes (Signed)
 See my chart note.

## 2024-03-23 ENCOUNTER — Other Ambulatory Visit (HOSPITAL_COMMUNITY): Payer: Self-pay

## 2024-03-24 ENCOUNTER — Encounter (HOSPITAL_COMMUNITY): Payer: Self-pay

## 2024-03-27 ENCOUNTER — Telehealth (HOSPITAL_COMMUNITY): Payer: Self-pay | Admitting: Emergency Medicine

## 2024-03-27 NOTE — Telephone Encounter (Signed)
 Pt reports she believes the wrong test was ordered. She wanted a cal score CT instead of the CCTA. Pt denies chest pain but has family history.   Order changed to cal score CT. Appt made for 03/29/24 at 7:00am at [facility]  Rockwell Alexandria RN Navigator [facility] [phone] Office  [phone] Cell

## 2024-03-28 ENCOUNTER — Ambulatory Visit (HOSPITAL_COMMUNITY)

## 2024-03-29 ENCOUNTER — Ambulatory Visit (HOSPITAL_COMMUNITY)
Admission: RE | Admit: 2024-03-29 | Discharge: 2024-03-29 | Disposition: A | Discharge status: Home / Self Care | Payer: Self-pay | Source: Ambulatory Visit | Attending: Family Medicine | Admitting: Family Medicine

## 2024-03-29 DIAGNOSIS — I10 Essential (primary) hypertension: Secondary | ICD-10-CM | POA: Insufficient documentation

## 2024-03-29 DIAGNOSIS — Z136 Encounter for screening for cardiovascular disorders: Secondary | ICD-10-CM | POA: Insufficient documentation

## 2024-04-01 ENCOUNTER — Encounter: Payer: Self-pay | Admitting: Family Medicine

## 2024-04-01 NOTE — Progress Notes (Signed)
 See mychart note Dear [name], The results of your cardiac calcium score are 0. This keeps you in the low risk category. At this time, we do not need to start cholesterol or a heart protective statin. I will continue to monitor your risk for you over time. We need to ensure your blood pressure is well controlled.  The scan does show atherosclerosis in the aorta or calcium in the aorta; this is a common finding. Nothing further is needed at this time.  Sincerely, Dr. Mardelle Matte

## 2024-04-27 ENCOUNTER — Other Ambulatory Visit (HOSPITAL_COMMUNITY): Payer: Self-pay

## 2024-05-10 ENCOUNTER — Ambulatory Visit: Payer: Commercial Managed Care - PPO | Admitting: Internal Medicine

## 2024-05-25 ENCOUNTER — Other Ambulatory Visit (HOSPITAL_COMMUNITY): Payer: Self-pay

## 2024-06-14 ENCOUNTER — Ambulatory Visit: Admitting: Family Medicine

## 2024-07-03 ENCOUNTER — Ambulatory Visit: Admitting: Family Medicine

## 2024-07-04 ENCOUNTER — Other Ambulatory Visit: Payer: Self-pay | Admitting: Family Medicine

## 2024-07-04 ENCOUNTER — Ambulatory Visit: Admitting: Family Medicine

## 2024-07-04 NOTE — Telephone Encounter (Signed)
 Prescription Request  07/04/2024  LOV: 03/14/2024  What is the name of the medication or equipment? amphetamine -dextroamphetamine  (ADDERALL  XR) 20 MG 24 hr capsule   Have you contacted your pharmacy to request a refill? Yes   Which pharmacy would you like this sent to?  [facility] Phone: [phone]  Fax: [phone]      Patient notified that their request is being sent to the clinical staff for review and that they should receive a response within 2 business days.   Please advise at Mobile [phone] (mobile)

## 2024-07-04 NOTE — Telephone Encounter (Signed)
 03/14/2024 lov  01/13/2024  30/0 refills

## 2024-07-05 MED ORDER — AMPHETAMINE-DEXTROAMPHET ER 20 MG PO CP24: 20.0000 mg | ORAL_CAPSULE | Freq: Every day | ORAL | 0 refills | Status: DC

## 2024-07-10 ENCOUNTER — Ambulatory Visit: Admitting: Family Medicine

## 2024-07-10 ENCOUNTER — Encounter: Payer: Self-pay | Admitting: Family Medicine

## 2024-07-10 VITALS — BP 154/87 | HR 60 | Temp 98.1°F | Ht 65.0 in | Wt 160.2 lb

## 2024-07-10 DIAGNOSIS — E559 Vitamin D deficiency, unspecified: Secondary | ICD-10-CM | POA: Diagnosis not present

## 2024-07-10 DIAGNOSIS — I10 Essential (primary) hypertension: Secondary | ICD-10-CM | POA: Diagnosis not present

## 2024-07-10 DIAGNOSIS — E538 Deficiency of other specified B group vitamins: Secondary | ICD-10-CM | POA: Diagnosis not present

## 2024-07-10 DIAGNOSIS — G43109 Migraine with aura, not intractable, without status migrainosus: Secondary | ICD-10-CM | POA: Insufficient documentation

## 2024-07-10 DIAGNOSIS — F900 Attention-deficit hyperactivity disorder, predominantly inattentive type: Secondary | ICD-10-CM | POA: Diagnosis not present

## 2024-07-10 MED ORDER — AMPHETAMINE-DEXTROAMPHET ER 20 MG PO CP24: 20.0000 mg | ORAL_CAPSULE | Freq: Every day | ORAL | 0 refills | Status: DC

## 2024-07-10 MED ORDER — VALSARTAN 80 MG PO TABS
80.0000 mg | ORAL_TABLET | Freq: Every day | ORAL | 3 refills | Status: AC
  Filled 2024-09-03 – 2024-09-13 (×2): qty 90, 90d supply, fill #0
  Filled 2024-11-30: qty 90, 90d supply, fill #1

## 2024-07-10 MED ORDER — METOPROLOL SUCCINATE ER 50 MG PO TB24
50.0000 mg | ORAL_TABLET | Freq: Every day | ORAL | 3 refills | Status: AC
  Filled 2024-09-03 – 2024-09-13 (×2): qty 90, 90d supply, fill #0
  Filled 2024-11-30: qty 90, 90d supply, fill #1

## 2024-07-10 NOTE — Patient Instructions (Signed)
 Please return in 6 months for your annual complete physical; please come fasting.  For follow up on chronic medical conditions   If you have any questions or concerns, please don't hesitate to send me a message via MyChart or call the office at [phone]. Thank you for visiting with us  today! It's our pleasure caring for you.

## 2024-07-10 NOTE — Progress Notes (Signed)
 Subjective  CC:  Chief Complaint  Patient presents with   ADHD   Hypertension    Mammo is scheduled for September    HPI: Alison [name] is a 53 y.o. female who presents to the office today to address the problems listed above in the chief complaint. Hypertension f/u:  Discussed the use of AI scribe software for clinical note transcription with the patient, who gave verbal consent to proceed.  History of Present Illness ANALY [name] is a 53 year old female with hypertension who presents for hypertension and ADD follow-up with recent migraine-like headaches.  Approximately two weeks ago, she experienced a sudden onset of a new type of headache while working on a computer, accompanied by visual disturbances described as 'wavies' in her vision, similar to an aura. This was followed by a severe headache causing nausea, necessitating rest in a dark room. She managed the headache with Naprosyn  and sleep, and it resolved after a few hours. The headache started in her temple, similar to her mother's headaches, and she has not experienced a similar headache in the past 20 years.  She has a history of hypertension and is currently on a beta blocker, which was recently increased. She feels sluggish, dizzy, and tired on the higher dose. Her blood pressure readings at home and in the office have varied, with some higher readings reported. Initially, she was on hydrochlorothiazide , which was not effective. There is a strong family history of heart disease and stroke, with her mother having had a stroke.  She has been taking vitamin B12 and vitamin D  supplements for a couple of weeks due to mild anemia. Her daughter and her daughter's boyfriend have moved in with her temporarily, which she describes as a positive but busy change in her household.  No current pain from a recent dental procedure. History of thyroid  issues, but she has not had her thyroid  rechecked recently due to a canceled  appointment.   Assessment  1. Attention deficit hyperactivity disorder (ADHD), predominantly inattentive type   2. Essential hypertension   3. Vitamin B12 deficiency   4. Vitamin D  deficiency   5. Migraine with aura and without status migrainosus, not intractable      Plan  Assessment and Plan Assessment & Plan Migraine Classic migraine suspected. Discussed common triggers. - Monitor migraine frequency and triggers. - Use Excedrin as needed. - Consider migraine management if episodes increase. Discussed red flag symptoms.  Hypertension Blood pressure elevated with fluctuations. Beta blocker may cause fatigue. Family history of heart disease and stroke noted. - Reduce beta blocker dose to 50 mg.  Toprol -XL 50 daily - Add valsartan .  80 mg daily - Monitor blood pressure regularly. - Report blood pressure readings.  Anemia Slight anemia present. Advised vitamin B12 and D for anemia and overall health. - Continue vitamin B12 and D supplements daily. - Recheck blood levels next visit.  ADD remains well-controlled: Refilled medication.  Monitor blood pressures.   General Health Maintenance Postmenopausal. Vitamin D  advised for bone health. Mammogram due. - Continue vitamin D . - Attend mammogram in September.   Education regarding management of these chronic disease states was given. Management strategies discussed on successive visits include dietary and exercise recommendations, goals of achieving and maintaining IBW, and lifestyle modifications aiming for adequate sleep and minimizing stressors.  Follow up: January for complete physical and follow-up chronic problems  No orders of the defined types were placed in this encounter.  Meds ordered this encounter  Medications  amphetamine -dextroamphetamine  (ADDERALL  XR) 20 MG 24 hr capsule    Sig: Take 1 capsule (20 mg total) by mouth daily.    Dispense:  30 capsule    Refill:  0   metoprolol  succinate (TOPROL -XL) 50 MG 24  hr tablet    Sig: Take 1 tablet (50 mg total) by mouth daily. Take with or immediately following a meal.    Dispense:  90 tablet    Refill:  3   valsartan  (DIOVAN ) 80 MG tablet    Sig: Take 1 tablet (80 mg total) by mouth daily.    Dispense:  90 tablet    Refill:  3   amphetamine -dextroamphetamine  (ADDERALL  XR) 20 MG 24 hr capsule    Sig: Take 1 capsule (20 mg total) by mouth daily.    Dispense:  30 capsule    Refill:  0   amphetamine -dextroamphetamine  (ADDERALL  XR) 20 MG 24 hr capsule    Sig: Take 1 capsule (20 mg total) by mouth daily.    Dispense:  30 capsule    Refill:  0   amphetamine -dextroamphetamine  (ADDERALL  XR) 20 MG 24 hr capsule    Sig: Take 1 capsule (20 mg total) by mouth daily.    Dispense:  30 capsule    Refill:  0   amphetamine -dextroamphetamine  (ADDERALL  XR) 20 MG 24 hr capsule    Sig: Take 1 capsule (20 mg total) by mouth daily.    Dispense:  30 capsule    Refill:  0   amphetamine -dextroamphetamine  (ADDERALL  XR) 20 MG 24 hr capsule    Sig: Take 1 capsule (20 mg total) by mouth daily.    Dispense:  30 capsule    Refill:  0      BP Readings from Last 3 Encounters:  07/10/24 (!) 154/87  03/14/24 (!) 145/78  01/13/24 (!) 143/89   Wt Readings from Last 3 Encounters:  07/10/24 160 lb 3.2 oz (72.7 kg)  03/14/24 156 lb 3.2 oz (70.9 kg)  01/13/24 159 lb (72.1 kg)    Lab Results  Component Value Date   CHOL 258 (H) 01/13/2024   CHOL 211 (H) 01/29/2023   CHOL 223 (H) 05/14/2021   Lab Results  Component Value Date   HDL 70.90 01/13/2024   HDL 60.10 01/29/2023   HDL 63.20 05/14/2021   Lab Results  Component Value Date   LDLCALC 151 (H) 01/13/2024   LDLCALC 129 (H) 01/29/2023   LDLCALC 137 (H) 05/14/2021   Lab Results  Component Value Date   TRIG 177.0 (H) 01/13/2024   TRIG 113.0 01/29/2023   TRIG 115.0 05/14/2021   Lab Results  Component Value Date   CHOLHDL 4 01/13/2024   CHOLHDL 4 01/29/2023   CHOLHDL 4 05/14/2021   No results found for:  LDLDIRECT Lab Results  Component Value Date   CREATININE 0.81 01/13/2024   BUN 11 01/13/2024   NA 138 01/13/2024   K 3.9 01/13/2024   CL 101 01/13/2024   CO2 28 01/13/2024    The 10-year ASCVD risk score (Arnett DK, et al., 2019) is: 4%   Values used to calculate the score:     Age: 45 years     Clincally relevant sex: Female     Is Non-Hispanic African American: No     Diabetic: No     Tobacco smoker: No     Systolic Blood Pressure: 154 mmHg     Is BP treated: Yes     HDL Cholesterol: 70.9 mg/dL  Total Cholesterol: 258 mg/dL  I reviewed the patients updated PMH, FH, and SocHx.    Patient Active Problem List   Diagnosis Date Noted   Depression, major, single episode, mild (HCC) 05/14/2020    Priority: High   Attention deficit hyperactivity disorder 10/02/2019    Priority: High   Essential hypertension 05/09/2018    Priority: High   Hashimoto's thyroiditis 01/13/2024    Priority: Medium    Multinodular goiter 11/05/2023    Priority: Medium    Left thyroid  nodule 06/03/2021    Priority: Medium    Osteopenia 10/02/2019    Priority: Medium    DJD (degenerative joint disease), lumbar 05/09/2018    Priority: Medium    Vitamin B12 deficiency 01/19/2024    Priority: Low   Urinary, incontinence, stress female 05/14/2021    Priority: Low   Seasonal allergic rhinitis due to pollen 05/14/2021    Priority: Low   Genital warts 10/02/2019    Priority: Low   Goiter 06/20/2019    Priority: Low   Vitamin D  deficiency 07/10/2024   Migraine with aura and without status migrainosus, not intractable 07/10/2024    Allergies: Patient has no known allergies.  Social History: Patient  reports that she has quit smoking. Her smoking use included cigarettes. She has never used smokeless tobacco. She reports that she does not drink alcohol and does not use drugs.  Current Meds  Medication Sig   metoprolol  succinate (TOPROL -XL) 50 MG 24 hr tablet Take 1 tablet (50 mg total) by  mouth daily. Take with or immediately following a meal.   valsartan  (DIOVAN ) 80 MG tablet Take 1 tablet (80 mg total) by mouth daily.   [DISCONTINUED] amphetamine -dextroamphetamine  (ADDERALL  XR) 20 MG 24 hr capsule Take 1 capsule (20 mg total) by mouth daily.   [DISCONTINUED] amphetamine -dextroamphetamine  (ADDERALL  XR) 20 MG 24 hr capsule Take 1 capsule (20 mg total) by mouth daily.   [DISCONTINUED] amphetamine -dextroamphetamine  (ADDERALL  XR) 20 MG 24 hr capsule Take 1 capsule (20 mg total) by mouth daily.   [DISCONTINUED] amphetamine -dextroamphetamine  (ADDERALL  XR) 20 MG 24 hr capsule Take 1 capsule (20 mg total) by mouth daily.   [DISCONTINUED] amphetamine -dextroamphetamine  (ADDERALL  XR) 20 MG 24 hr capsule Take 1 capsule (20 mg total) by mouth daily.   [DISCONTINUED] amphetamine -dextroamphetamine  (ADDERALL  XR) 20 MG 24 hr capsule Take 1 capsule (20 mg total) by mouth daily.   [DISCONTINUED] metoprolol  succinate (TOPROL -XL) 100 MG 24 hr tablet Take 1 tablet (100 mg total) by mouth daily.    Review of Systems: Cardiovascular: negative for chest pain, palpitations, leg swelling, orthopnea Respiratory: negative for SOB, wheezing or persistent cough Gastrointestinal: negative for abdominal pain Genitourinary: negative for dysuria or gross hematuria  Objective  Vitals: BP (!) 154/87   Pulse 60   Temp 98.1 F (36.7 C)   Ht 5' 5 (1.651 m)   Wt 160 lb 3.2 oz (72.7 kg)   SpO2 100%   BMI 26.66 kg/m  General: no acute distress  Psych:  Alert and oriented, normal mood and affect HEENT:  Normocephalic, atraumatic, supple neck  Cardiovascular:  RRR without murmur. no edema Respiratory:  Good breath sounds bilaterally, CTAB with normal respiratory effort Skin:  Warm, no rashes Neurologic:   Mental status is normal Commons side effects, risks, benefits, and alternatives for medications and treatment plan prescribed today were discussed, and the patient expressed understanding of the given  instructions. Patient is instructed to call or message via MyChart if he/she has any questions or concerns regarding  our treatment plan. No barriers to understanding were identified. We discussed Red Flag symptoms and signs in detail. Patient expressed understanding regarding what to do in case of urgent or emergency type symptoms.  Medication list was reconciled, printed and provided to the patient in AVS. Patient instructions and summary information was reviewed with the patient as documented in the AVS. This note was prepared with assistance of Dragon voice recognition software. Occasional wrong-word or sound-a-like substitutions may have occurred due to the inherent limitation

## 2024-09-02 ENCOUNTER — Other Ambulatory Visit (HOSPITAL_COMMUNITY): Payer: Self-pay

## 2024-09-03 ENCOUNTER — Other Ambulatory Visit (HOSPITAL_COMMUNITY): Payer: Self-pay

## 2024-09-04 ENCOUNTER — Encounter: Payer: Self-pay | Admitting: Family Medicine

## 2024-09-04 ENCOUNTER — Other Ambulatory Visit (HOSPITAL_COMMUNITY): Payer: Self-pay

## 2024-09-04 DIAGNOSIS — Z01419 Encounter for gynecological examination (general) (routine) without abnormal findings: Secondary | ICD-10-CM | POA: Diagnosis not present

## 2024-09-04 DIAGNOSIS — Z6827 Body mass index (BMI) 27.0-27.9, adult: Secondary | ICD-10-CM | POA: Diagnosis not present

## 2024-09-04 DIAGNOSIS — Z1151 Encounter for screening for human papillomavirus (HPV): Secondary | ICD-10-CM | POA: Diagnosis not present

## 2024-09-04 DIAGNOSIS — Z124 Encounter for screening for malignant neoplasm of cervix: Secondary | ICD-10-CM | POA: Diagnosis not present

## 2024-09-04 DIAGNOSIS — Z1231 Encounter for screening mammogram for malignant neoplasm of breast: Secondary | ICD-10-CM | POA: Diagnosis not present

## 2024-09-04 MED ORDER — AMPHETAMINE-DEXTROAMPHET ER 20 MG PO CP24: 20.0000 mg | ORAL_CAPSULE | Freq: Every day | ORAL | 0 refills | Status: AC

## 2024-09-04 MED ORDER — AMPHETAMINE-DEXTROAMPHET ER 20 MG PO CP24
20.0000 mg | ORAL_CAPSULE | Freq: Every day | ORAL | 0 refills | Status: AC
  Filled 2024-11-15: qty 30, 30d supply, fill #0

## 2024-09-04 MED ORDER — AMPHETAMINE-DEXTROAMPHET ER 20 MG PO CP24
20.0000 mg | ORAL_CAPSULE | Freq: Every day | ORAL | 0 refills | Status: AC
  Filled 2024-09-04: qty 30, 30d supply, fill #0

## 2024-09-04 MED ORDER — AMPHETAMINE-DEXTROAMPHET ER 20 MG PO CP24
20.0000 mg | ORAL_CAPSULE | Freq: Every day | ORAL | 0 refills | Status: AC
  Filled 2024-10-06: qty 30, 30d supply, fill #0

## 2024-09-13 ENCOUNTER — Encounter: Payer: Self-pay | Admitting: Family Medicine

## 2024-09-13 ENCOUNTER — Other Ambulatory Visit (HOSPITAL_COMMUNITY): Payer: Self-pay

## 2024-10-06 ENCOUNTER — Other Ambulatory Visit (HOSPITAL_COMMUNITY): Payer: Self-pay

## 2024-11-15 ENCOUNTER — Other Ambulatory Visit (HOSPITAL_COMMUNITY): Payer: Self-pay

## 2024-11-30 ENCOUNTER — Other Ambulatory Visit (HOSPITAL_COMMUNITY): Payer: Self-pay

## 2024-12-25 ENCOUNTER — Telehealth: Payer: Self-pay | Admitting: Family Medicine

## 2024-12-25 NOTE — Telephone Encounter (Signed)
 LVM to R/S 01/16/25 appt with Jodie

## 2025-01-16 ENCOUNTER — Encounter: Admitting: Family Medicine
